# Patient Record
Sex: Male | Born: 1990 | Race: White | Hispanic: No | Marital: Married | Smoking: Current every day smoker
Health system: Southern US, Community
[De-identification: ages and names within clinical notes are randomized; demographics above are authoritative.]

## PROBLEM LIST (undated history)

## (undated) DIAGNOSIS — J329 Chronic sinusitis, unspecified: Secondary | ICD-10-CM

## (undated) DIAGNOSIS — L509 Urticaria, unspecified: Secondary | ICD-10-CM

## (undated) DIAGNOSIS — F419 Anxiety disorder, unspecified: Secondary | ICD-10-CM

## (undated) DIAGNOSIS — J45909 Unspecified asthma, uncomplicated: Secondary | ICD-10-CM

## (undated) HISTORY — PX: TONSILLECTOMY: SUR1361

## (undated) HISTORY — DX: Chronic sinusitis, unspecified: J32.9

## (undated) HISTORY — DX: Urticaria, unspecified: L50.9

## (undated) HISTORY — PX: CHOLECYSTECTOMY: SHX55

## (undated) HISTORY — DX: Anxiety disorder, unspecified: F41.9

## (undated) HISTORY — DX: Unspecified asthma, uncomplicated: J45.909

## (undated) HISTORY — PX: WISDOM TOOTH EXTRACTION: SHX21

---
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
## 2014-02-12 ENCOUNTER — Emergency Department (HOSPITAL_COMMUNITY)
Admission: EM | Admit: 2014-02-12 | Discharge: 2014-02-12 | Disposition: A | Discharge status: Home / Self Care | Payer: Self-pay | Attending: Emergency Medicine | Admitting: Emergency Medicine

## 2014-02-12 ENCOUNTER — Emergency Department (HOSPITAL_COMMUNITY): Payer: Self-pay

## 2014-02-12 DIAGNOSIS — T148XXA Other injury of unspecified body region, initial encounter: Secondary | ICD-10-CM

## 2014-02-12 DIAGNOSIS — S90222A Contusion of left lesser toe(s) with damage to nail, initial encounter: Secondary | ICD-10-CM

## 2014-02-12 DIAGNOSIS — X58XXXA Exposure to other specified factors, initial encounter: Secondary | ICD-10-CM | POA: Insufficient documentation

## 2014-02-12 DIAGNOSIS — Y998 Other external cause status: Secondary | ICD-10-CM | POA: Insufficient documentation

## 2014-02-12 DIAGNOSIS — Y9289 Other specified places as the place of occurrence of the external cause: Secondary | ICD-10-CM | POA: Insufficient documentation

## 2014-02-12 DIAGNOSIS — S90122A Contusion of left lesser toe(s) without damage to nail, initial encounter: Secondary | ICD-10-CM | POA: Insufficient documentation

## 2014-02-12 DIAGNOSIS — Y9389 Activity, other specified: Secondary | ICD-10-CM | POA: Insufficient documentation

## 2014-02-12 DIAGNOSIS — R52 Pain, unspecified: Secondary | ICD-10-CM

## 2014-02-12 DIAGNOSIS — S93602A Unspecified sprain of left foot, initial encounter: Secondary | ICD-10-CM

## 2014-02-12 MED ORDER — HYDROCODONE-ACETAMINOPHEN 5-325 MG PO TABS: 1.0000 | ORAL_TABLET | Freq: Four times a day (QID) | ORAL | Status: DC | PRN

## 2014-02-12 MED ORDER — HYDROMORPHONE HCL 1 MG/ML IJ SOLN
1.0000 mg | Freq: Once | INTRAMUSCULAR | Status: AC
  Administered 2014-02-12: 1 mg via INTRAVENOUS
  Filled 2014-02-12: qty 1
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
## 2014-02-12 NOTE — Discharge Instructions (Signed)
It was our pleasure to provide your ER care today - we hope that you feel better.  Elevate foot.  Use crutches and camwalker as need for comfort/support for the next few days.   Ice/coldpacks to sore area.  Take motrin or aleve as need for pain. You may also take hydrocodone as need for pain. No driving for the next 6 hours or when taking hydrocodone. Also, do not take tylenol or acetaminophen containing medication when taking hydrocodone.  Although no fracture is seen on today's xrays, there is the possibility of occult fracture, ligamentous and other soft tissue injury to the foot.   Follow up with orthopedist in 1 week if symptoms fail to improve/resolve - see referral - call to arrange appointment.    You were given pain medication in the ER - no driving for the next 4 hours.    Foot Contusion A foot contusion is a deep bruise to the foot. Contusions are the result of an injury that caused bleeding under the skin. The contusion may turn blue, purple, or yellow. Minor injuries will give you a painless contusion, but more severe contusions may stay painful and swollen for a few weeks. CAUSES  A foot contusion comes from a direct blow to that area, such as a heavy object falling on the foot. SYMPTOMS   Swelling of the foot.  Discoloration of the foot.  Tenderness or soreness of the foot. DIAGNOSIS  You will have a physical exam and will be asked about your history. You may need an X-ray of your foot to look for a broken bone (fracture).  TREATMENT  An elastic wrap may be recommended to support your foot. Resting, elevating, and applying cold compresses to your foot are often the best treatments for a foot contusion. Over-the-counter medicines may also be recommended for pain control. HOME CARE INSTRUCTIONS  1. Put ice on the injured area. 1. Put ice in a plastic bag. 2. Place a towel between your skin and the bag. 3. Leave the ice on for 15-20 minutes, 03-04 times a day. 2. Only  take over-the-counter or prescription medicines for pain, discomfort, or fever as directed by your caregiver. 3. If told, use an elastic wrap as directed. This can help reduce swelling. You may remove the wrap for sleeping, showering, and bathing. If your toes become numb, cold, or blue, take the wrap off and reapply it more loosely. 4. Elevate your foot with pillows to reduce swelling. 5. Try to avoid standing or walking while the foot is painful. Do not resume use until instructed by your caregiver. Then, begin use gradually. If pain develops, decrease use. Gradually increase activities that do not cause discomfort until you have normal use of your foot. 6. See your caregiver as directed. It is very important to keep all follow-up appointments in order to avoid any lasting problems with your foot, including long-term (chronic) pain. SEEK IMMEDIATE MEDICAL CARE IF:  1. You have increased redness, swelling, or pain in your foot. 2. Your swelling or pain is not relieved with medicines. 3. You have loss of feeling in your foot or are unable to move your toes. 4. Your foot turns cold or blue. 5. You have pain when you move your toes. 6. Your foot becomes warm to the touch. 7. Your contusion does not improve in 2 days. MAKE SURE YOU:  1. Understand these instructions. 2. Will watch your condition. 3. Will get help right away if you are not doing well or  get worse. Document Released: 11/24/2005 Document Revised: 08/04/2011 Document Reviewed: 01/06/2011 [facility] Patient Information 2015 [place], [place]. This information is not intended to replace advice given to you by your health care provider. Make sure you discuss any questions you have with your health care provider.    Foot Sprain The muscles and cord like structures which attach muscle to bone (tendons) that surround the feet are made up of units. A foot sprain can occur at the weakest spot in any of these units. This condition is most often  caused by injury to or overuse of the foot, as from playing contact sports, or aggravating a previous injury, or from poor conditioning, or obesity. SYMPTOMS  Pain with movement of the foot.  Tenderness and swelling at the injury site.  Loss of strength is present in moderate or severe sprains. THE THREE GRADES OR SEVERITY OF FOOT SPRAIN ARE: 7. Mild (Grade I): Slightly pulled muscle without tearing of muscle or tendon fibers or loss of strength. 8. Moderate (Grade II): Tearing of fibers in a muscle, tendon, or at the attachment to bone, with small decrease in strength. 9. Severe (Grade III): Rupture of the muscle-tendon-bone attachment, with separation of fibers. Severe sprain requires surgical repair. Often repeating (chronic) sprains are caused by overuse. Sudden (acute) sprains are caused by direct injury or over-use. DIAGNOSIS  Diagnosis of this condition is usually by your own observation. If problems continue, a caregiver may be required for further evaluation and treatment. X-rays may be required to make sure there are not breaks in the bones (fractures) present. Continued problems may require physical therapy for treatment. PREVENTION 8. Use strength and conditioning exercises appropriate for your sport. 9. Warm up properly prior to working out. 10. Use athletic shoes that are made for the sport you are participating in. 11. Allow adequate time for healing. Early return to activities makes repeat injury more likely, and can lead to an unstable arthritic foot that can result in prolonged disability. Mild sprains generally heal in 3 to 10 days, with moderate and severe sprains taking 2 to 10 weeks. Your caregiver can help you determine the proper time required for healing. HOME CARE INSTRUCTIONS  4. Apply ice to the injury for 15-20 minutes, 03-04 times per day. Put the ice in a plastic bag and place a towel between the bag of ice and your skin. 5. An elastic wrap (like an Ace bandage)  may be used to keep swelling down. 6. Keep foot above the level of the heart, or at least raised on a footstool, when swelling and pain are present. 7. Try to avoid use other than gentle range of motion while the foot is painful. Do not resume use until instructed by your caregiver. Then begin use gradually, not increasing use to the point of pain. If pain does develop, decrease use and continue the above measures, gradually increasing activities that do not cause discomfort, until you gradually achieve normal use. 8. Use crutches if and as instructed, and for the length of time instructed. 9. Keep injured foot and ankle wrapped between treatments. 10. Massage foot and ankle for comfort and to keep swelling down. Massage from the toes up towards the knee. 11. Only take over-the-counter or prescription medicines for pain, discomfort, or fever as directed by your caregiver. SEEK IMMEDIATE MEDICAL CARE IF:  1. Your pain and swelling increase, or pain is not controlled with medications. 2. You have loss of feeling in your foot or your foot turns cold  or blue. 3. You develop new, unexplained symptoms, or an increase of the symptoms that brought you to your caregiver. MAKE SURE YOU:  1. Understand these instructions. 2. Will watch your condition. 3. Will get help right away if you are not doing well or get worse. Document Released: 07/25/2001 Document Revised: 04/27/2011 Document Reviewed: 09/22/2007 [facility] Patient Information 2015 [place], [place]. This information is not intended to replace advice given to you by your health care provider. Make sure you discuss any questions you have with your health care provider.   Cryotherapy Cryotherapy means treatment with cold. Ice or gel packs can be used to reduce both pain and swelling. Ice is the most helpful within the first 24 to 48 hours after an injury or flare-up from overusing a muscle or joint. Sprains, strains, spasms, burning pain, shooting pain,  and aches can all be eased with ice. Ice can also be used when recovering from surgery. Ice is effective, has very few side effects, and is safe for most people to use. PRECAUTIONS  Ice is not a safe treatment option for people with:  Raynaud phenomenon. This is a condition affecting small blood vessels in the extremities. Exposure to cold may cause your problems to return.  Cold hypersensitivity. There are many forms of cold hypersensitivity, including:  Cold urticaria. Red, itchy hives appear on the skin when the tissues begin to warm after being iced.  Cold erythema. This is a red, itchy rash caused by exposure to cold.  Cold hemoglobinuria. Red blood cells break down when the tissues begin to warm after being iced. The hemoglobin that carry oxygen are passed into the urine because they cannot combine with blood proteins fast enough.  Numbness or altered sensitivity in the area being iced. If you have any of the following conditions, do not use ice until you have discussed cryotherapy with your caregiver: 10. Heart conditions, such as arrhythmia, angina, or chronic heart disease. 11. High blood pressure. 12. Healing wounds or open skin in the area being iced. 13. Current infections. 14. Rheumatoid arthritis. 15. Poor circulation. 16. Diabetes. Ice slows the blood flow in the region it is applied. This is beneficial when trying to stop inflamed tissues from spreading irritating chemicals to surrounding tissues. However, if you expose your skin to cold temperatures for too long or without the proper protection, you can damage your skin or nerves. Watch for signs of skin damage due to cold. HOME CARE INSTRUCTIONS Follow these tips to use ice and cold packs safely. 12. Place a dry or damp towel between the ice and skin. A damp towel will cool the skin more quickly, so you may need to shorten the time that the ice is used. 13. For a more rapid response, add gentle compression to the  ice. 14. Ice for no more than 10 to 20 minutes at a time. The bonier the area you are icing, the less time it will take to get the benefits of ice. 15. Check your skin after 5 minutes to make sure there are no signs of a poor response to cold or skin damage. 16. Rest 20 minutes or more between uses. 17. Once your skin is numb, you can end your treatment. You can test numbness by very lightly touching your skin. The touch should be so light that you do not see the skin dimple from the pressure of your fingertip. When using ice, most people will feel these normal sensations in this order: cold, burning, aching, and  numbness. 18. Do not use ice on someone who cannot communicate their responses to pain, such as small children or people with dementia. HOW TO MAKE AN ICE PACK Ice packs are the most common way to use ice therapy. Other methods include ice massage, ice baths, and cryosprays. Muscle creams that cause a cold, tingly feeling do not offer the same benefits that ice offers and should not be used as a substitute unless recommended by your caregiver. To make an ice pack, do one of the following: 12. Place crushed ice or a bag of frozen vegetables in a sealable plastic bag. Squeeze out the excess air. Place this bag inside another plastic bag. Slide the bag into a pillowcase or place a damp towel between your skin and the bag. 13. Mix 3 parts water with 1 part rubbing alcohol. Freeze the mixture in a sealable plastic bag. When you remove the mixture from the freezer, it will be slushy. Squeeze out the excess air. Place this bag inside another plastic bag. Slide the bag into a pillowcase or place a damp towel between your skin and the bag. SEEK MEDICAL CARE IF: 4. You develop white spots on your skin. This may give the skin a blotchy (mottled) appearance. 5. Your skin turns blue or pale. 6. Your skin becomes waxy or hard. 7. Your swelling gets worse. MAKE SURE YOU:  4. Understand these  instructions. 5. Will watch your condition. 6. Will get help right away if you are not doing well or get worse. Document Released: 09/29/2010 Document Revised: 06/19/2013 Document Reviewed: 09/29/2010 [facility] Patient Information 2015 [place], [place]. This information is not intended to replace advice given to you by your health care provider. Make sure you discuss any questions you have with your health care provider.   Crutch Use Crutches are used to take weight off one of your legs or feet when you stand or walk. It is important to use crutches that fit properly. When fitted properly:  Each crutch should be 2-3 finger widths below the armpit.  Your weight should be supported by your hand, and not by resting the armpit on the crutch.  RISKS AND COMPLICATIONS Damage to the nerves that extend from your armpit to your hand and arm. To prevent this from happening, make sure your crutches fit properly and do not put pressure on your armpit when using them. HOW TO USE YOUR CRUTCHES If you have been instructed to use partial weight bearing, apply (bear) the amount of weight as your health care provider suggests. Do not bear weight in an amount that causes pain to the area of injury. Walking 17. Step with the crutches. 18. Swing the healthy leg slightly ahead of the crutches. Going Up Steps If there is no handrail: 19. Step up with the healthy leg. 20. Step up with the crutches and injured leg. 21. Continue in this way. If there is a handrail: 14. Hold both crutches in one hand. 15. Place your free hand on the handrail. 16. While putting your weight on your arms, lift your healthy leg to the step. 17. Bring the crutches and the injured leg up to that step. 18. Continue in this way. Going Down Steps Be very careful, as going down stairs with crutches is very challenging. If there is no handrail: 8. Step down with the injured leg and crutches. 9. Step down with the healthy leg. If there  is a handrail: 7. Place your hand on the handrail. 8. Hold both crutches  with your free hand. 9. Lower your injured leg and crutch to the step below you. Make sure to keep the crutch tips in the center of the step, never on the edge. 10. Lower your healthy leg to that step. 11. Continue in this way. Standing Up 1. Hold the injured leg forward. 2. Grab the armrest with one hand and the top of the crutches with the other hand. 3. Using these supports, pull yourself up to a standing position. Sitting Down 1. Hold the injured leg forward. 2. Grab the armrest with one hand and the top of the crutches with the other hand. 3. Lower yourself to a sitting position. SEEK MEDICAL CARE IF:  You still feel unsteady on your feet.  You develop new pain, for example in your armpits, back, shoulder, wrist, or hip.  You develop any numbness or tingling. SEEK IMMEDIATE MEDICAL CARE IF: You fall. Document Released: 01/31/2000 Document Revised: 02/07/2013 Document Reviewed: 10/10/2012 [facility] Patient Information 2015 [place], [place]. This information is not intended to replace advice given to you by your health care provider. Make sure you discuss any questions you have with your health care provider.

## 2014-02-12 NOTE — ED Notes (Signed)
Notified ortho for need for assistive device

## 2014-02-12 NOTE — ED Notes (Signed)
Bed: [facility] Expected date:  Expected time:  Means of arrival:  Comments: ems 
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
## 2014-02-25 ENCOUNTER — Telehealth (HOSPITAL_BASED_OUTPATIENT_CLINIC_OR_DEPARTMENT_OTHER): Payer: Self-pay | Admitting: Emergency Medicine

## 2014-02-26 ENCOUNTER — Telehealth (HOSPITAL_COMMUNITY): Payer: Self-pay

## 2015-11-15 IMAGING — CR DG FOOT COMPLETE 3+V*L*
3 series · 3 of 3 positions shown · non-contrast
Comparison: None.

EXAM:
LEFT FOOT - COMPLETE 3+ VIEW

[x foot ap left]
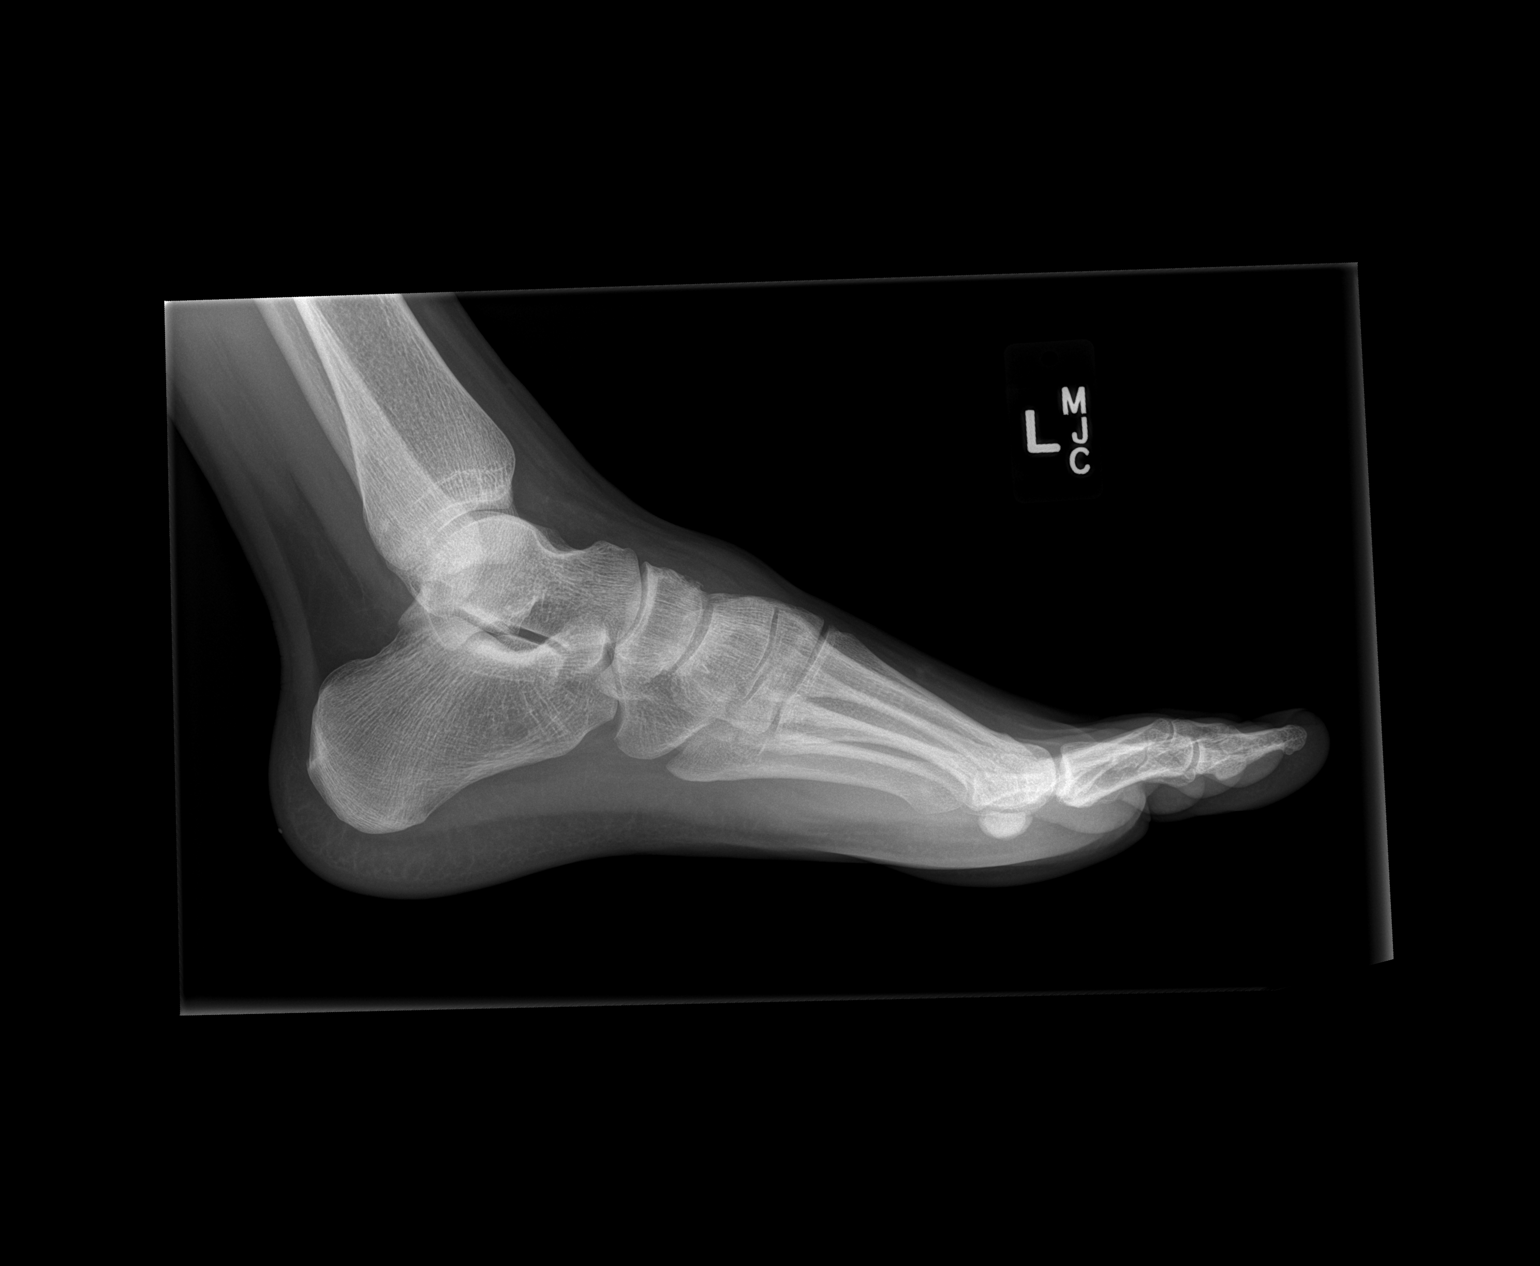

[x foot obl left]
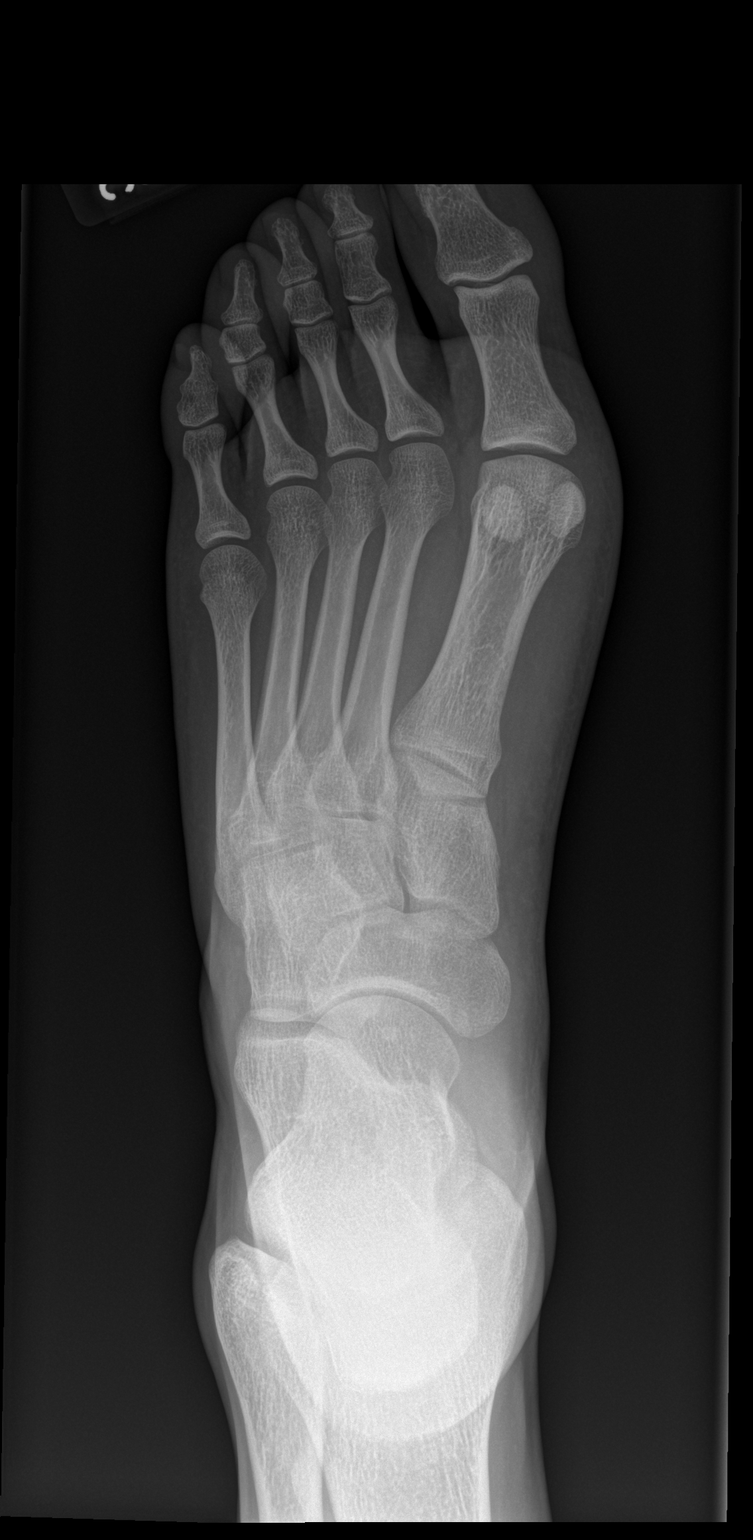

[x foot lat left]
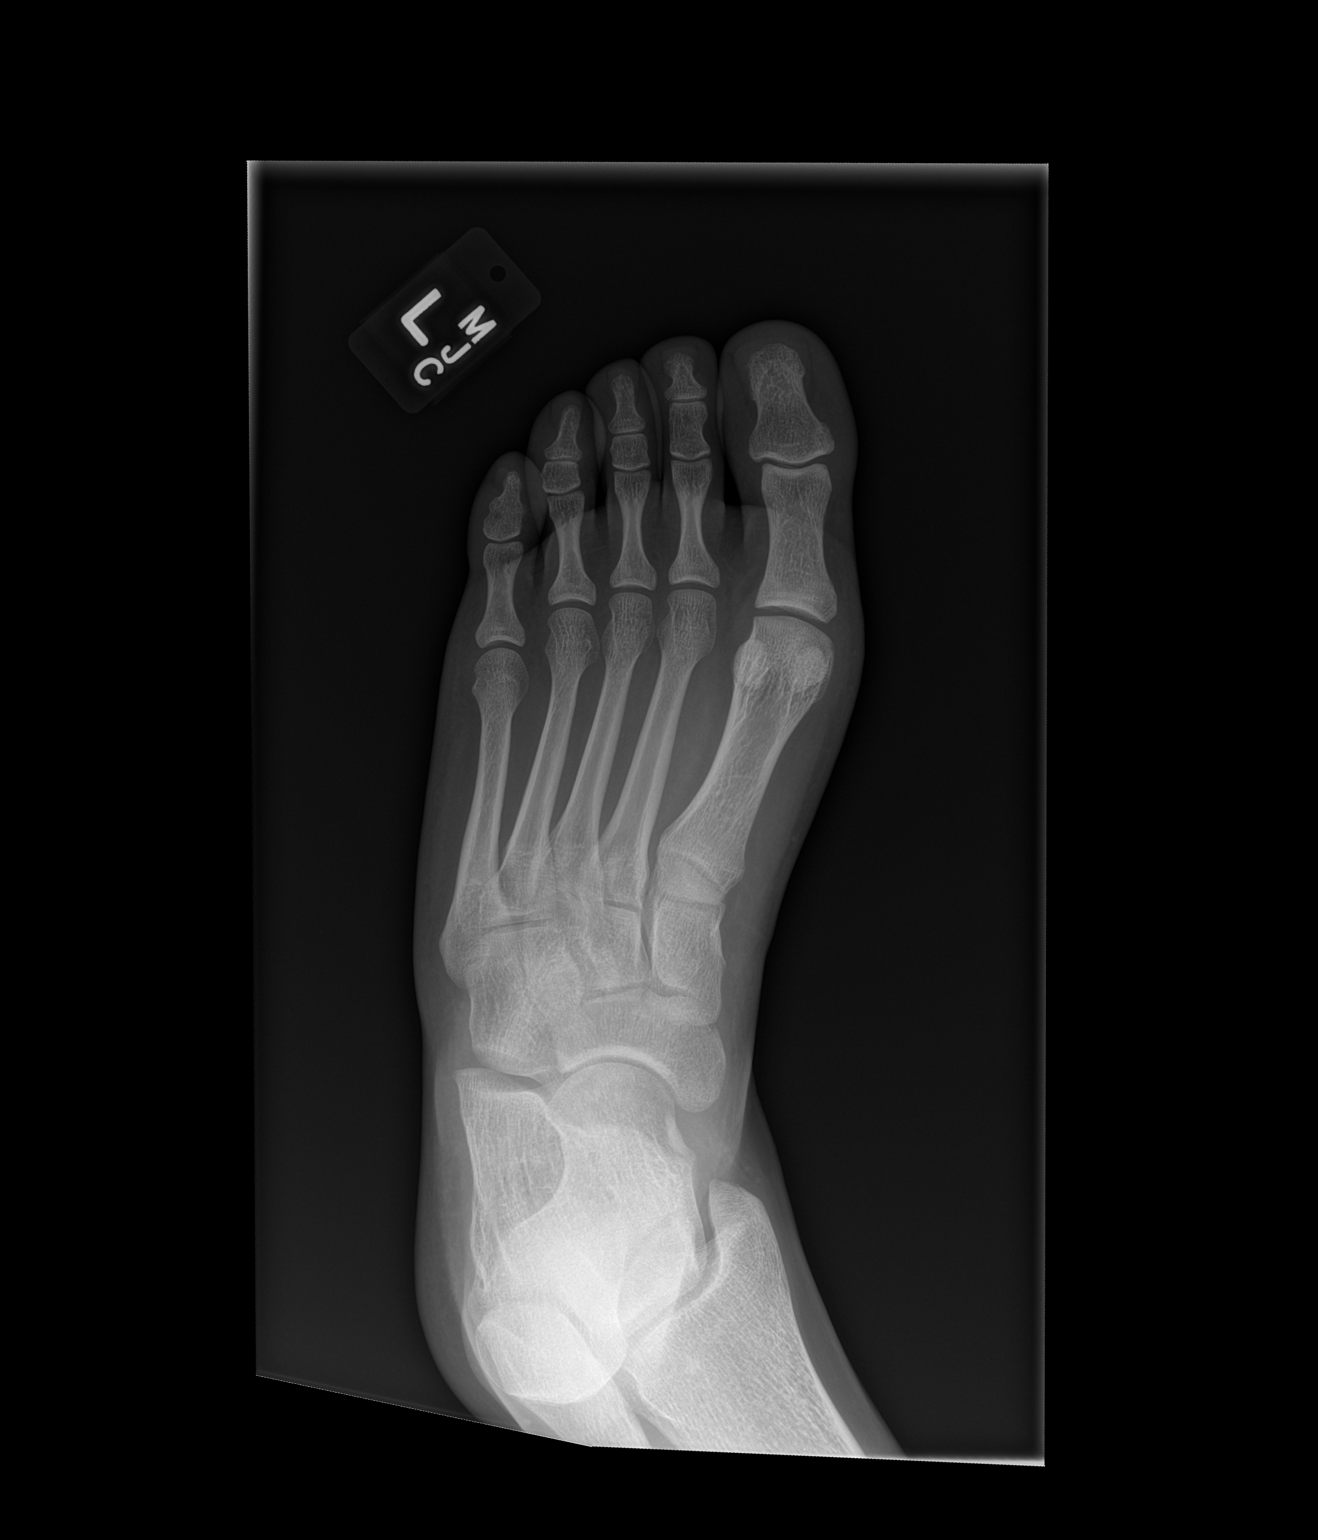

[3 of 3 positions shown; findings below may reference images not displayed]

FINDINGS: Mild hallux valgus is present. There is no fracture. Soft tissues
appear within normal limits.
IMPRESSION: Negative.

## 2017-03-23 ENCOUNTER — Encounter: Payer: Self-pay | Admitting: Allergy

## 2017-03-23 ENCOUNTER — Ambulatory Visit (INDEPENDENT_AMBULATORY_CARE_PROVIDER_SITE_OTHER): Payer: 59 | Admitting: Allergy

## 2017-03-23 VITALS — BP 136/92 | HR 76 | Temp 98.3°F | Resp 16 | Ht 71.0 in | Wt 218.7 lb

## 2017-03-23 DIAGNOSIS — R05 Cough: Secondary | ICD-10-CM | POA: Diagnosis not present

## 2017-03-23 DIAGNOSIS — H1010 Acute atopic conjunctivitis, unspecified eye: Secondary | ICD-10-CM

## 2017-03-23 DIAGNOSIS — J309 Allergic rhinitis, unspecified: Secondary | ICD-10-CM

## 2017-03-23 DIAGNOSIS — R058 Other specified cough: Secondary | ICD-10-CM

## 2017-03-23 DIAGNOSIS — R769 Abnormal immunological finding in serum, unspecified: Secondary | ICD-10-CM

## 2017-03-23 MED ORDER — FLUTICASONE FUROATE 100 MCG/ACT IN AEPB: 1.0000 | INHALATION_SPRAY | Freq: Every day | RESPIRATORY_TRACT | 5 refills | Status: DC

## 2017-03-23 MED ORDER — EPINEPHRINE 0.3 MG/0.3ML IJ SOAJ: INTRAMUSCULAR | 1 refills | Status: DC

## 2017-03-23 MED ORDER — AZELASTINE HCL 0.1 % NA SOLN: NASAL | 5 refills | Status: DC

## 2017-03-23 MED ORDER — LEVALBUTEROL TARTRATE 45 MCG/ACT IN AERO: 2.0000 | INHALATION_SPRAY | Freq: Four times a day (QID) | RESPIRATORY_TRACT | 1 refills | Status: DC | PRN

## 2017-03-23 NOTE — Progress Notes (Signed)
New Patient Note  RE: Gregory Lambert MRN: [id number] DOB: 03/10/1990 Date of Office Visit: 03/23/2017  Referring provider: No ref. provider found Primary care provider: Hal Morales, NP  Chief Complaint: allergy issue  History of present illness: Gregory Lambert is a 27 y.o. male presenting today for evaluation of allergy symptoms with abnormal labs.    He reports he has "spells" where he has difficulty breathing and feeling dizzy.  This has been going on for couple years.  He feels these episodes have gotten worse to the point where he is having trouble working as the spells have become more frequent. He works outside a lot and is not sure if he is reacting to allergens in the environment.  These spells occur about daily and last about a hour before resolving.  He also states he has been having a cough for the past 6 months.  Cough is usually dry and does feel like there is an itch in his throat that then leads to cough.  He does feel that he has some wheeze and chest tightness as well.  He reports the nighttime is better time of the day for him as he does not recall having any breathing difficulties at night.  He also states he has times where he lips and tongue tingle, throat feels tight and ears itch.  He denies any swelling.  The newest symptoms he has been having is hives that are itchy and usually on his back but have been wrapping around towards this abdomen.  Hives started about a month or so ago.  He does report generalized itch.  No joint aches/pains.  He reports he had episode of rash after wearing a "cheap sweater" to an [employer] event this Christmas; this was the first time with rash and then it return about a month ago without an identifiable trigger.  He states they did change dryer sheets about a month ago and his 80yr old son also has a similar type rash and his clothes are also dried with dryer sheets.  They have since stopped using the dryer sheets.      For treatment of  symptoms he had been taking Xyzal.  He states when he first started taking xyzal his ysmptoms improved but symptoms came back about about 2 weeks later.  He states the cough he had developed while on Xyzal and read that xyzal could develop a cough thus he stopped.  However he did try it again and did not feel it was helping so he stopped it again.  He has flonase but reports very infrequent use.  He does use Afrin for severe nasal obstruction and only uses for couple days at a time. He was recommend to take singulair and he took first dose and felt "not himself" and like "zombie" so he stopped use.  For the breathing issues he states he did use a family members albuterol inhaler and does not feel it helped.     He did see Dr. Fransico Setters with [facility] in October however pt states he was told he did not have any allergies from his testing and did not need any additional testing.  However upon reviewing record and previous testing it was noted he was sensitive to dust mites, cat, dog and cockroach.   The serum IgE testing was done by his PCP prior to seeing Dr. Fransico Setters.  Food allergy testing was done by the PCP as well that showed sensitivity to milk, shrimp and  scallop.  He reports eating/drinking dairy products without issue as well as shrimp.  He has never had scallop.  He also had gluten IgG done that was elevated and per pt he was told he was allergic to gluten and thus he took gluten out of the diet for about a month and he did not notice a difference in his symptoms but he put gluten back in the diet.  He denies having any cutaneous, respiratory, GI or CV related symptoms with any food ingestions.      He denies history of eczema or previous diagnosis of asthma and prior dagnosis of food allergy.    Review of systems: Review of Systems  Constitutional: Negative for chills, fever and malaise/fatigue.  HENT: Positive for congestion. Negative for ear discharge, ear pain, hearing loss, nosebleeds, sinus  pain, sore throat and tinnitus.   Eyes: Negative for pain, discharge and redness.  Respiratory: Positive for cough, shortness of breath and wheezing. Negative for sputum production.   Cardiovascular: Negative for chest pain.  Gastrointestinal: Negative for abdominal pain, constipation, diarrhea, heartburn, nausea and vomiting.  Musculoskeletal: Negative for joint pain.  Skin: Positive for itching and rash.  Neurological: Negative for headaches.    All other systems negative unless noted above in HPI  Past medical history: Past Medical History:  Diagnosis Date  . Anxiety   . Sinusitis   . Urticaria     Past surgical history: Past Surgical History:  Procedure Laterality Date  . CHOLECYSTECTOMY    . TONSILLECTOMY    . WISDOM TOOTH EXTRACTION      Family history:  Family History  Problem Relation Age of Onset  . Asthma Maternal Grandmother   . Allergic rhinitis Neg Hx   . Angioedema Neg Hx   . Eczema Neg Hx   . Immunodeficiency Neg Hx   . Urticaria Neg Hx     Social history: He lives in a home without carpeting with gas and electric heating and central cooling.  There are 2 dogs in the home.  There is no concern for water damage, mildew or roaches in the home.  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  Tobacco Use  . Smoking status: Current Some Day Smoker    Packs/day: 0.25    Years: 17.00    Pack years: 4.25    Types: Cigarettes  . Smokeless tobacco: Current User    Types: Chew    Medication List: Allergies as of 03/23/2017      Reactions   Montelukast    Gave a weird feeling      Medication List        Accurate as of 03/23/17 12:13 PM. Always use your most recent med list.          clonazePAM 0.5 MG tablet Commonly known as:  KLONOPIN   levocetirizine 5 MG tablet Commonly known as:  XYZAL       Known medication allergies: Allergies  Allergen Reactions  . Montelukast     Gave a weird feeling     Physical examination: Blood pressure (!) 136/92,  pulse 76, temperature 98.3 F (36.8 C), temperature source Oral, resp. rate 16, height 5\' 11"  (1.803 m), weight 218 lb 11.1 oz (99.2 kg).  General: Alert, interactive, in no acute distress. HEENT: PERRLA, TMs pearly gray, turbinates moderately edematous with clear discharge, post-pharynx non erythematous. Neck: Supple without lymphadenopathy. Lungs: Clear to auscultation without wheezing, rhonchi or rales. {no increased work of breathing. CV: Normal S1, S2 without  murmurs. Abdomen: Nondistended, nontender. Skin: Warm and dry, without lesions or rashes. Extremities:  No clubbing, cyanosis or edema. Neuro:   Grossly intact.  Diagnositics/Labs: Labs:  Serum IgE testing in kU/L from 09/29/2016:  Dermatophagoides Pteronyssinus IgE (d1) 2.70 (H)  Dermatophagoides Farinae IgE (d2) 3.09 (H)  Cat Dander  0.86 (H)  DOG DANDER 1.63 (H)  Cockroach,German IgE (i6) 0.49 (H)   Negative to Mouse Urine Proteins, [place] Southern Territ[place]MUDA GRASS, Timothy Grass, JOHNSON GRASS, Penicillium Notatum, CLADOSPORIUM HERBARUM, ASPERGILLUS, Alternaria Alternata, Box Elder IgE, Cottonwood Tree, Pecan/Hickory Tree IgE, EdgewaterBirch, Ryder SystemMountain Juniper IgE (t6), Live/[place] Gur[place]Oak, [address] Southeast[place] Tree, [place]m Tree, Common Ragweed, Rough Pigweed IgE, Sheep Sorrel IgE   Food IgE in kU/L:  Egg White <0.10  Milk, Cow's 2.95 (H)  Fish Cod <0.10  Wheat IgE 0.10 (H)  PEANUT <0.10  Soybean IgE <0.10  Hazelnut <0.10  Almonds <0.10  Shrimp IgE 0.45 (H)  Tuna IgE  <0.10   Salmon <0.10  Cashew <0.10  Scallop IgE 0.11 (H)  F010 Sesame Seed <0.10  WALNUT <0.10  Gluten  <0.10   Gluten IgG  <2.0 mcg/mL 17.3 (H)    IMMUNOGLOBULIN E  267.00 (H)   WBC 4.8 - 10.8 x 10*3/uL 8.3  Red Blood Count 4.70 - 6.10 x 10*6/uL 5.26  HEMOGLOBIN 14.0 - 18.0 G/DL 16.116.3  HEMATOCRIT 09.642.0 - 52.0 % 47.5  MCV 80.0 - 94.0 FL 90.4  MCH 27.0 - 31.0 PG 31.0  MCHC 33.0 - 37.0 G/DL 04.534.3  RDW 40.911.5 - 81.114.5 % 13.5  PLATELET COUNT 160 - 360 X 10*3/uL 256  MPV 6.8 - 10.2 FL 9.5    NEUTROPHILS RELATIVE PERCENT % 62  LYMPHOCYTES RELATIVE PERCENT % 27  MONOCYTES RELATIVE PERCENT 3 - 12 % 8  EOSINOPHILS RELATIVE PERCENT % 2  BASOPHILS RELATIVE PERCENT % 1  NEUTROPHILS ABSOLUTE COUNT 1.6 - 7.3 x 10*3/uL 5.2  LYMPHOCYTES ABSOLUTE COUNT 1.0 - 5.1 x 10*3/uL 2.2  MONOCYTES ABSOLUTE COUNT 0.2 - 0.9 x 10*3/uL 0.6  EOSINOPHILS ABSOLUTE COUNT 0.0 - 0.5 x 10*3/uL 0.2  BASOPHILS ABSOLUTE COUNT 0.0 - 0.2 x 10*3/uL 0.1  SODIUM 135 - 146 MMOL/L 135  POTASSIUM 3.5 - 5.3 MMOL/L 3.9  CHLORIDE 98 - 110 MMOL/L 100  CO2 23 - 30 MMOL/L 25  BUN 8 - 24 MG/DL 11  GLUCOSE 70 - 99 MG/DL 98  CREATININE 9.140.50 - 7.821.50 MG/DL 9.560.91  CALCIUM 8.5 - 21.310.5 MG/DL 08.610.1  TOTAL PROTEIN 6.0 - 8.3 G/DL 7.8  Albumin 3.5 - 5.0 G/DL 4.8  BILIRUBIN TOTAL 0.1 - 1.2 MG/DL 0.5  ALKALINE PHOSPHATASE 25 - 125 IU/L 80  AST 5 - 40 IU/L 71 (H)  ALT 5 - 50 IU/L 147 (H)  ANION GAP 4 - 14 MMOL/L 10  EST. GFR NON-BLACK >=60  EST. GFR BLACK >60   TSH  0.450 - 5.330 UIU/ML 1.920  T4 TOTAL  5.5 - 11.8 mcg/dL 6.5  T3 Uptake  57.832.0 - 45.0 % 36.0  FREE THYROXINE INDEX  4.8 - 11.3 6.0  CK  50 - 180 IU/L 155  TROPONIN I  0.000 - 0.040 NG/ML <0.006   Spirometry: FEV1: 4.18L  90%, FVC: 4.79L  84%, ratio consistent with nonobstructive pattern  Allergy testing: environmental allergy skin prick testing is positive to oak tree, dust mites, horse and cockroach.   Intradermal testing is positive to grass mix, mold mix 1,2,3,4, cat and dog Allergy testing results were read and interpreted by provider, documented by clinical staff.  Assessment and plan:   Allergic rhinoconjunctivitis    - environmental allergy testing is positive to tree pollen, grass pollen, dust mites, molds, cat, dog, horse and cockroach.  Allergen avoidance measures discussed and provided. - for antihistamine trial Allegra 180mg  daily  - ok to use Afrin as needed for severe nasal congestion no more than 3-5 days at a time.  Use 2 sprays each nostril then  wait 5-15 minutes until you are breathing more freely then follow with your medicated nose spray.  Provided with sample of Nasocort to use 2 sprays each nostril daily for nasal congestion.  Use for 1-2 weeks at a time before stopping once symptoms improve. - for post nasal drainage and throat itch recommend use of Astelin nasal antihistamine. Use 2 sprays each nostril up to twice a day.   - allergen immunotherapy (allergy injections) discussed today including protocol, benefits and risks.  Will prescribe Epipen if you decide to proceed to bring on days of your injections.  Please sign consent and can check with your insurance for coverage.  Call us when you are ready to start.   Cough - he may have a component of allergic asthma as respiratory symptoms appear to be tied with worsening allergy symptoms.  - will have him trial Arnuity 1 inhalation daily.   -  Xopenex for use 2 puffs every 4-6 hours as needed for cough/wheeze/shortness of breath/chest tightness.  May use 15-20 minutes prior to activity.   Monitor frequency of use.  Xopenex is being prescribed as he did not feel much improvement with use of albuterol.   - it is also likely that he may have a component of VCD leading to cough as he does endorse tickle in throat prior to onset of cough  Abnormal labs - testing to foods via serum IgE showed detectable levels to milk, shrimp and scallop.  He eats dairy products regularly without issue as well as eats shrimp on occasion without issue.  He has never had scallop.  Skin pricks to there foods are negative.  The detectable IgE levels represent false positive results as he tolerates the foods.  He does not have IgE mediated food allergy.  Also discussed that IgG is not useful as a diagnostic study in diagnosing IgE mediated food allergy.  It merely means he has had exposure to gluten.  He does not have a gluten allergy.    Follow-up 3-4 months or sooner if needed  I appreciate the opportunity  to take part in Hiram's care. Please do not hesitate to contact me with questions.  Sincerely,   Margo Aye, MD Allergy/Immunology [facility]

## 2017-03-23 NOTE — Patient Instructions (Addendum)
-   environmental allergy testing is positive to tree pollen, grass pollen, dust mites, molds, cat, dog, horse and cockroach Allergen avoidance measures discussed and provided.   - food allergy testing to milk, shrimp and scallop were negative thus your blood done was falsely positive. You do not have food allergy at this time  - allergen immunotherapy (allergy injections) discussed today including protocol, benefits and risks.  Will prescribe Epipen if you decide to proceed to bring on days of your injections.  Please sign consent and can check with your insurance for coverage.  Call us when you are ready to start.    - ok to use Afrin as needed for severe nasal congestion.  Use 2 sprays each nostril then wait 5-15 minutes until you are breathing more freely then follow with your medicated nose spray.  Provided with sample of Nasocort to use 2 sprays each nostril daily for nasal congestion.  Use for 1-2 weeks at a time before stopping once symptoms improve.    - for antihistamine trial Allegra 180mg  daily  - for post nasal drainage and throat itch recommend use of Astelin nasal antihistamine. Use 2 sprays each nostril up to twice a day.    - for cough will have you try Arnuity 100mcg 1 inhalation daily.  Take this until your follow-up visit.  This medication is an asthma control medication and should help decrease cough/respiratory symptoms you are having.    - will prescribe Xopenex for use 2 puffs every 4-6 hours as needed for cough/wheeze/shortness of breath/chest tightness.  May use 15-20 minutes prior to activity.   Monitor frequency of use.    Follow-up 3-4 months or sooner if needed

## 2017-04-01 DIAGNOSIS — J3089 Other allergic rhinitis: Secondary | ICD-10-CM | POA: Diagnosis not present

## 2017-04-01 NOTE — Progress Notes (Signed)
VIALS EXP 04-01-18 

## 2017-04-01 NOTE — Progress Notes (Addendum)
[facility]

## 2017-04-01 NOTE — Addendum Note (Signed)
Addended by: Lorrin MaisPADGETT, Colston Pyle P on: 04/01/2017 01:27 PM   Modules accepted: Orders

## 2017-04-02 DIAGNOSIS — J301 Allergic rhinitis due to pollen: Secondary | ICD-10-CM | POA: Diagnosis not present

## 2017-04-08 ENCOUNTER — Other Ambulatory Visit: Payer: Self-pay | Admitting: *Deleted

## 2017-04-08 ENCOUNTER — Ambulatory Visit (INDEPENDENT_AMBULATORY_CARE_PROVIDER_SITE_OTHER): Payer: 59 | Admitting: *Deleted

## 2017-04-08 ENCOUNTER — Ambulatory Visit: Payer: Self-pay | Admitting: Allergy and Immunology

## 2017-04-08 DIAGNOSIS — J309 Allergic rhinitis, unspecified: Secondary | ICD-10-CM

## 2017-04-08 MED ORDER — LEVOCETIRIZINE DIHYDROCHLORIDE 5 MG PO TABS: ORAL_TABLET | ORAL | 5 refills | Status: DC

## 2017-04-08 NOTE — Progress Notes (Signed)
Patient started injs Blue 100,000 ato 0.05 dosage each 1-mold-cr-mite, 1- pollen-pet Reviewed side effects, injection sch B 1-2 time weekly. Reviewed how/when to use epipen

## 2017-04-09 ENCOUNTER — Other Ambulatory Visit: Payer: Self-pay | Admitting: Allergy

## 2017-04-09 NOTE — Progress Notes (Signed)
Called by pt his regular pharmacy ([facility]) does not have arnuity in stock and he states he ran out of the sample provided in office 2 days ago and he has noted improvement in cough symptoms and would like to locate a device today to get back on it.  Located another pharmacy ([facility] [address]) with it in stock.

## 2017-04-12 ENCOUNTER — Ambulatory Visit (INDEPENDENT_AMBULATORY_CARE_PROVIDER_SITE_OTHER): Payer: 59 | Admitting: *Deleted

## 2017-04-12 DIAGNOSIS — J309 Allergic rhinitis, unspecified: Secondary | ICD-10-CM

## 2017-04-15 ENCOUNTER — Ambulatory Visit (INDEPENDENT_AMBULATORY_CARE_PROVIDER_SITE_OTHER): Payer: 59 | Admitting: *Deleted

## 2017-04-15 DIAGNOSIS — J309 Allergic rhinitis, unspecified: Secondary | ICD-10-CM

## 2017-04-19 ENCOUNTER — Ambulatory Visit (INDEPENDENT_AMBULATORY_CARE_PROVIDER_SITE_OTHER): Payer: 59 | Admitting: *Deleted

## 2017-04-19 DIAGNOSIS — J309 Allergic rhinitis, unspecified: Secondary | ICD-10-CM

## 2017-04-22 ENCOUNTER — Ambulatory Visit (INDEPENDENT_AMBULATORY_CARE_PROVIDER_SITE_OTHER): Payer: 59 | Admitting: *Deleted

## 2017-04-22 DIAGNOSIS — J309 Allergic rhinitis, unspecified: Secondary | ICD-10-CM

## 2017-04-26 ENCOUNTER — Ambulatory Visit: Payer: Self-pay | Admitting: *Deleted

## 2017-04-26 ENCOUNTER — Ambulatory Visit (INDEPENDENT_AMBULATORY_CARE_PROVIDER_SITE_OTHER): Payer: 59 | Admitting: Allergy and Immunology

## 2017-04-26 VITALS — BP 132/74 | HR 80 | Resp 20

## 2017-04-26 DIAGNOSIS — J309 Allergic rhinitis, unspecified: Secondary | ICD-10-CM | POA: Diagnosis not present

## 2017-04-26 DIAGNOSIS — K219 Gastro-esophageal reflux disease without esophagitis: Secondary | ICD-10-CM | POA: Diagnosis not present

## 2017-04-26 DIAGNOSIS — J4540 Moderate persistent asthma, uncomplicated: Secondary | ICD-10-CM

## 2017-04-26 DIAGNOSIS — R945 Abnormal results of liver function studies: Secondary | ICD-10-CM

## 2017-04-26 DIAGNOSIS — L299 Pruritus, unspecified: Secondary | ICD-10-CM

## 2017-04-26 DIAGNOSIS — R7989 Other specified abnormal findings of blood chemistry: Secondary | ICD-10-CM

## 2017-04-26 DIAGNOSIS — H1010 Acute atopic conjunctivitis, unspecified eye: Secondary | ICD-10-CM | POA: Diagnosis not present

## 2017-04-26 MED ORDER — METHYLPREDNISOLONE ACETATE 80 MG/ML IJ SUSP
80.0000 mg | Freq: Once | INTRAMUSCULAR | Status: AC
  Administered 2017-04-26: 80 mg via INTRAMUSCULAR

## 2017-04-26 MED ORDER — BUDESONIDE-FORMOTEROL FUMARATE 160-4.5 MCG/ACT IN AERO: 2.0000 | INHALATION_SPRAY | Freq: Two times a day (BID) | RESPIRATORY_TRACT | 2 refills | Status: DC

## 2017-04-26 NOTE — Progress Notes (Signed)
Follow-up Note  Referring Provider: Hal MoralesGunter, Tara G, NP Primary Provider: Hal MoralesGunter, Tara G, NP Date of Office Visit: 04/26/2017  Subjective:   Gregory Lambert (DOB: 31-May-1990) is a 27 y.o. male who [facility] in re-evaluation of the following:  HPI: Gregory Lambert returns to this clinic in evaluation of multiple issues addressed by Dr. Delorse LekPadgett on 23 March 2017.  During his last visit he was diagnosed with allergic rhinitis and possible component of asthma and he also had a pruritic disorder.  All the therapy prescribed during that visit has not helped him in regard to his respiratory tract or his pruritus.  He complains of cough which has been a nagging issue for at least 4 months or so ever since he moved into his 27 year old house approximally 4 months ago.  He has attempted to perform allergen avoidance measures against various aeroallergens including his animals who still remain inside the household.  He has throat clearing and drainage in his throat and apparently his throat clearing is driving everyone crazy in the family.  He does not really have a tremendous amount of chest tightness but does have shortness of breath on occasion.  He has tried a short acting bronchodilator which has not helped him and he was given Arnuity during his last visit which has not helped him.  He apparently has had a chest x-ray in the past which has been normal.  He does not have any significant symptoms to suggest classic reflux.  He does drink 2 coca colas per day and has 24 cans of beer per week.  He does have some issues with sniffing and some occasional congestion in his nose but he can smell without any difficulty and does not have a history of ugly nasal discharge.  He has been itchy for at least 4 months involving predominantly his torso.  Sometimes his neck is involved as well.  He has tried Xyzal which does help this issue.  With blood test obtained August  2018 he had a ALT 147 IU/L and AST 71 IU/L.  He has started immunotherapy directed against various aeroallergens for the past 3 weeks and this form of therapy has been going well without any adverse effect.  Allergies as of 04/26/2017      Reactions   Montelukast    Gave a weird feeling   Prednisone Palpitations      Medication List      azelastine 0.1 % nasal spray Commonly known as:  ASTELIN 2 sprays per nostril up to twice a day.   clonazePAM 0.5 MG tablet Commonly known as:  KLONOPIN 0.5 mg at bedtime as needed.   EPINEPHrine 0.3 mg/0.3 mL Soaj injection Commonly known as:  EPIPEN 2-PAK Use as directed for severe allergy.   Fluticasone Furoate 100 MCG/ACT Aepb Commonly known as:  ARNUITY ELLIPTA Inhale 1 puff into the lungs daily.   levalbuterol 45 MCG/ACT inhaler Commonly known as:  XOPENEX HFA Inhale 2 puffs into the lungs every 6 (six) hours as needed for wheezing.   levocetirizine 5 MG tablet Commonly known as:  XYZAL Take one tablet once daily as directed       Past Medical History:  Diagnosis Date  . Anxiety   . Sinusitis   . Urticaria     Past Surgical History:  Procedure Laterality Date  . CHOLECYSTECTOMY    . TONSILLECTOMY    . WISDOM TOOTH EXTRACTION  Review of systems negative except as noted in HPI / PMHx or noted below:  Review of Systems  Constitutional: Negative.   HENT: Negative.   Eyes: Negative.   Respiratory: Negative.   Cardiovascular: Negative.   Gastrointestinal: Negative.   Genitourinary: Negative.   Musculoskeletal: Negative.   Skin: Negative.   Neurological: Negative.   Endo/Heme/Allergies: Negative.   Psychiatric/Behavioral: Negative.      Objective:   Vitals:   04/26/17 0946  BP: 132/74  Pulse: 80  Resp: 20          Physical Exam  Constitutional: He is well-developed, well-nourished, and in no distress.  Nothing, throat clearing, slight cough  HENT:  Head: Normocephalic.  Right Ear: Tympanic  membrane, external ear and ear canal normal.  Left Ear: Tympanic membrane, external ear and ear canal normal.  Nose: Nose normal. No mucosal edema or rhinorrhea.  Mouth/Throat: Uvula is midline, oropharynx is clear and moist and mucous membranes are normal. No oropharyngeal exudate.  Eyes: Conjunctivae are normal.  Neck: Trachea normal. No tracheal tenderness present. No tracheal deviation present. No thyromegaly present.  Cardiovascular: Normal rate, regular rhythm, S1 normal, S2 normal and normal heart sounds.  No murmur heard. Pulmonary/Chest: Breath sounds normal. No stridor. No respiratory distress. He has no wheezes (end expiratory wheezing heard on forced expiration posterior lung field). He has no rales.  Musculoskeletal: He exhibits no edema.  Lymphadenopathy:       Head (right side): No tonsillar adenopathy present.       Head (left side): No tonsillar adenopathy present.    He has no cervical adenopathy.  Neurological: He is alert. Gait normal.  Skin: No rash noted. He is not diaphoretic. No erythema. Nails show no clubbing.  Psychiatric: Mood and affect normal.    Diagnostics:    Spirometry was performed and demonstrated an FEV1 of 3.93 at 84 % of predicted.  Following the administration of nebulized albuterol his FEV1 did not change significantly.  The patient had an Asthma Control Test with the following results: ACT Total Score: 20.    Assessment and Plan:   1. Not well controlled moderate persistent asthma   2. Allergic rhinoconjunctivitis   3. LPRD (laryngopharyngeal reflux disease)   4. Pruritic disorder   5. Elevated liver function tests     1.  Perform allergen avoidance measures directed against house dust mite and animals as best as possible  2.  Treat and prevent inflammation:   A.  Symbicort 160 - 2 inhalations twice a day with spacer  B.  OTC Nasacort 1 spray each nostril 1 time per day  C.  Depo-Medrol 80 IM delivered in clinic today  3.  Treat and  prevent reflux:   A.  Consolidate caffeine and alcohol consumption  B.  OTC Ranitidine 150 1 tablet twice a day  4.  Treat and prevent itchiness:   A.  Xyzal 5 mg twice a day  5. If needed:   A. Proair HFA or similar - 2 inhalations every 4-6 hours  B. Nasal saline  5.  Investigate itchiness and elevated liver function tests with the following blood tests: CMP, CBC w/diff, TSH, T4, TP  6.  Further evaluation for elevated liver function test?  Yes if still elevated  7.  Continue immunotherapy and EpiPen / Auvi-Q  8.  Return to clinic 3 weeks or earlier if problem  Yafet has several issues that need to be addressed including his cough, what appears to be partially on  the basis of LPR, pruritic disorder, and elevated liver function tests.  I have assigned a plan noted above to address each 1 of these issues.  If he still has elevated liver function tests I think we are obligated to further investigate the cause of this abnormality as this may also be tied up with his pruritic disorder.  Hopefully his elevated liver function tests are just a manifestation of his relatively high alcohol intake.  If he still continues to have cough in the face of this therapy I think it would be worthwhile to obtain a CT scan of the sinuses to look for a persistent infection which may also be contributing to some of his pruritic disorder and also have him evaluated by ENT to take a look at his throat.  I will regroup with him in 3 weeks to assess his response to treatment.  Laurette Schimke, MD Allergy / Immunology [facility]

## 2017-04-26 NOTE — Patient Instructions (Addendum)
  1.  Perform allergen avoidance measures directed against house dust mite and animals as best as possible  2.  Treat and prevent inflammation:   A.  Symbicort 160 - 2 inhalations twice a day with spacer  B.  OTC Nasacort 1 spray each nostril 1 time per day  C.  Depo-Medrol 80 IM delivered in clinic today  3.  Treat and prevent reflux:   A.  Consolidate caffeine and alcohol consumption  B.  OTC Ranitidine 150 1 tablet twice a day  4.  Treat and prevent itchiness:   A.  Xyzal 5 mg twice a day  5. If needed:   A. Proair HFA or similar - 2 inhalations every 4-6 hours  B. Nasal saline  5.  Investigate itchiness and elevated liver function tests with the following blood tests: CMP, CBC w/diff, TSH, T4, TP  6.  Further evaluation for elevated liver function test?  Yes if still elevated  7.  Continue immunotherapy and EpiPen / Auvi-Q  8.  Return to clinic 3 weeks or earlier if problem

## 2017-04-27 ENCOUNTER — Encounter: Payer: Self-pay | Admitting: Allergy and Immunology

## 2017-04-28 LAB — COMPREHENSIVE METABOLIC PANEL
A/G RATIO: 2.2 (ref 1.2–2.2)
ALT: 143 IU/L — AB (ref 0–44)
AST: 62 IU/L — AB (ref 0–40)
Albumin: 5.3 g/dL (ref 3.5–5.5)
Alkaline Phosphatase: 89 IU/L (ref 39–117)
BUN/Creatinine Ratio: 12 (ref 9–20)
BUN: 12 mg/dL (ref 6–20)
Bilirubin Total: 0.4 mg/dL (ref 0.0–1.2)
CALCIUM: 9.8 mg/dL (ref 8.7–10.2)
CO2: 24 mmol/L (ref 20–29)
Chloride: 97 mmol/L (ref 96–106)
Creatinine, Ser: 0.97 mg/dL (ref 0.76–1.27)
GFR calc Af Amer: 124 mL/min/{1.73_m2} (ref >59)
GFR calc non Af Amer: 107 mL/min/{1.73_m2} (ref >59)
Globulin, Total: 2.4 g/dL (ref 1.5–4.5)
Glucose: 92 mg/dL (ref 65–99)
Potassium: 4.7 mmol/L (ref 3.5–5.2)
Sodium: 139 mmol/L (ref 134–144)
Total Protein: 7.7 g/dL (ref 6.0–8.5)

## 2017-04-28 LAB — CBC WITH DIFFERENTIAL/PLATELET
Basophils Absolute: 0 10*3/uL (ref 0.0–0.2)
Basos: 0 %
EOS (ABSOLUTE): 0.2 10*3/uL (ref 0.0–0.4)
Eos: 2 %
Hematocrit: 46.3 % (ref 37.5–51.0)
Hemoglobin: 15.6 g/dL (ref 13.0–17.7)
IMMATURE GRANULOCYTES: 0 %
Immature Grans (Abs): 0 10*3/uL (ref 0.0–0.1)
Lymphocytes Absolute: 2.4 10*3/uL (ref 0.7–3.1)
Lymphs: 23 %
MCH: 30.7 pg (ref 26.6–33.0)
MCHC: 33.7 g/dL (ref 31.5–35.7)
MCV: 91 fL (ref 79–97)
Monocytes Absolute: 0.5 10*3/uL (ref 0.1–0.9)
Monocytes: 5 %
NEUTROS PCT: 70 %
Neutrophils Absolute: 7.4 10*3/uL — ABNORMAL HIGH (ref 1.4–7.0)
PLATELETS: 310 10*3/uL (ref 150–379)
RBC: 5.08 x10E6/uL (ref 4.14–5.80)
RDW: 13.4 % (ref 12.3–15.4)
WBC: 10.6 10*3/uL (ref 3.4–10.8)

## 2017-04-28 LAB — TSH: TSH: 2.63 u[IU]/mL (ref 0.450–4.500)

## 2017-04-28 LAB — THYROID PEROXIDASE ANTIBODY: THYROID PEROXIDASE ANTIBODY: 12 [IU]/mL (ref 0–34)

## 2017-04-28 LAB — T4: T4 TOTAL: 6.4 ug/dL (ref 4.5–12.0)

## 2017-04-29 ENCOUNTER — Ambulatory Visit (INDEPENDENT_AMBULATORY_CARE_PROVIDER_SITE_OTHER): Payer: 59 | Admitting: *Deleted

## 2017-04-29 DIAGNOSIS — J309 Allergic rhinitis, unspecified: Secondary | ICD-10-CM

## 2017-05-03 ENCOUNTER — Telehealth: Payer: Self-pay | Admitting: Allergy and Immunology

## 2017-05-03 ENCOUNTER — Ambulatory Visit (INDEPENDENT_AMBULATORY_CARE_PROVIDER_SITE_OTHER): Payer: 59 | Admitting: *Deleted

## 2017-05-03 ENCOUNTER — Other Ambulatory Visit: Payer: Self-pay | Admitting: *Deleted

## 2017-05-03 DIAGNOSIS — J309 Allergic rhinitis, unspecified: Secondary | ICD-10-CM | POA: Diagnosis not present

## 2017-05-03 DIAGNOSIS — R945 Abnormal results of liver function studies: Principal | ICD-10-CM

## 2017-05-03 DIAGNOSIS — R7989 Other specified abnormal findings of blood chemistry: Secondary | ICD-10-CM

## 2017-05-03 NOTE — Telephone Encounter (Signed)
Patient came today for allergy injections Patient left and went to have blood work done Was told there that they were drawing for allergies and his results would be back in about week Patient wanted to know what else he was being testing for since he had his injections today Please call

## 2017-05-03 NOTE — Telephone Encounter (Signed)
Called patient and informed him that we are doing blood tests to try to see why he has elevated liver function tests. We did not order any allergy blood tests.  Patient did ask if he needed to have milk blood test redone since he had it done with another doctor last year and it was positive.  I told him that he had a negative skin test for milk when he was tested here and patient says that he can drink milk without a problem.  I told him that sometimes blood tests can have false positives and that if he can consume milk without a problem then that might be true in his case.

## 2017-05-05 LAB — HBV/HCV (PROFILE VIII)
HCV Ab: <0.1 s/co ratio (ref 0.0–0.9)
HEP B C TOTAL AB: NEGATIVE
HEP B E AB: NEGATIVE
HEP B S AG: NEGATIVE
Hep B C IgM: NEGATIVE
Hep B E Ag: NEGATIVE
Hep B Surface Ab, Qual: REACTIVE

## 2017-05-05 LAB — ANTI-SMOOTH MUSCLE ANTIBODY, IGG: SMOOTH MUSCLE AB: 9 U (ref 0–19)

## 2017-05-05 LAB — ANTI-MICROSOMAL ANTIBODY LIVER / KIDNEY: LKM1 Ab: 3.8 Units (ref 0.0–20.0)

## 2017-05-05 LAB — MITOCHONDRIAL ANTIBODIES: Mitochondrial Ab: <20 Units (ref 0.0–20.0)

## 2017-05-05 LAB — HCV COMMENT:

## 2017-05-05 LAB — ANA W/REFLEX IF POSITIVE: Anti Nuclear Antibody(ANA): NEGATIVE

## 2017-05-05 LAB — HIV ANTIBODY (ROUTINE TESTING W REFLEX): HIV Screen 4th Generation wRfx: NONREACTIVE

## 2017-05-06 ENCOUNTER — Ambulatory Visit (INDEPENDENT_AMBULATORY_CARE_PROVIDER_SITE_OTHER): Payer: 59 | Admitting: *Deleted

## 2017-05-06 ENCOUNTER — Telehealth: Payer: Self-pay | Admitting: Allergy and Immunology

## 2017-05-06 DIAGNOSIS — J309 Allergic rhinitis, unspecified: Secondary | ICD-10-CM | POA: Diagnosis not present

## 2017-05-06 NOTE — Telephone Encounter (Signed)
Explained difference in balances and that he received his vials in February - kt

## 2017-05-06 NOTE — Telephone Encounter (Signed)
Can came in and asked for his balance.  When I told him how much it is he questioned the amount.  He states he received a bill that had a different amount owed.  Then asked about the charges on 2-14 and on 2-15.  He would like a call back please.

## 2017-05-10 ENCOUNTER — Ambulatory Visit (INDEPENDENT_AMBULATORY_CARE_PROVIDER_SITE_OTHER): Payer: 59 | Admitting: *Deleted

## 2017-05-10 DIAGNOSIS — J309 Allergic rhinitis, unspecified: Secondary | ICD-10-CM | POA: Diagnosis not present

## 2017-05-13 ENCOUNTER — Ambulatory Visit (INDEPENDENT_AMBULATORY_CARE_PROVIDER_SITE_OTHER): Payer: 59 | Admitting: *Deleted

## 2017-05-13 DIAGNOSIS — J309 Allergic rhinitis, unspecified: Secondary | ICD-10-CM | POA: Diagnosis not present

## 2017-05-19 ENCOUNTER — Ambulatory Visit: Payer: 59 | Admitting: Allergy and Immunology

## 2017-05-20 ENCOUNTER — Ambulatory Visit (INDEPENDENT_AMBULATORY_CARE_PROVIDER_SITE_OTHER): Payer: 59 | Admitting: *Deleted

## 2017-05-20 DIAGNOSIS — J309 Allergic rhinitis, unspecified: Secondary | ICD-10-CM

## 2017-05-24 ENCOUNTER — Ambulatory Visit (INDEPENDENT_AMBULATORY_CARE_PROVIDER_SITE_OTHER): Payer: 59 | Admitting: *Deleted

## 2017-05-24 DIAGNOSIS — J309 Allergic rhinitis, unspecified: Secondary | ICD-10-CM | POA: Diagnosis not present

## 2017-05-26 ENCOUNTER — Ambulatory Visit (INDEPENDENT_AMBULATORY_CARE_PROVIDER_SITE_OTHER): Payer: 59 | Admitting: Allergy and Immunology

## 2017-05-26 ENCOUNTER — Encounter: Payer: Self-pay | Admitting: Allergy and Immunology

## 2017-05-26 VITALS — BP 120/96 | HR 80 | Resp 20

## 2017-05-26 DIAGNOSIS — H1010 Acute atopic conjunctivitis, unspecified eye: Secondary | ICD-10-CM

## 2017-05-26 DIAGNOSIS — B370 Candidal stomatitis: Secondary | ICD-10-CM

## 2017-05-26 DIAGNOSIS — F17210 Nicotine dependence, cigarettes, uncomplicated: Secondary | ICD-10-CM

## 2017-05-26 DIAGNOSIS — L299 Pruritus, unspecified: Secondary | ICD-10-CM

## 2017-05-26 DIAGNOSIS — K219 Gastro-esophageal reflux disease without esophagitis: Secondary | ICD-10-CM | POA: Diagnosis not present

## 2017-05-26 DIAGNOSIS — R7989 Other specified abnormal findings of blood chemistry: Secondary | ICD-10-CM

## 2017-05-26 DIAGNOSIS — J4540 Moderate persistent asthma, uncomplicated: Secondary | ICD-10-CM

## 2017-05-26 DIAGNOSIS — J309 Allergic rhinitis, unspecified: Secondary | ICD-10-CM | POA: Diagnosis not present

## 2017-05-26 DIAGNOSIS — R945 Abnormal results of liver function studies: Secondary | ICD-10-CM

## 2017-05-26 MED ORDER — AUVI-Q 0.3 MG/0.3ML IJ SOAJ: INTRAMUSCULAR | 3 refills | Status: DC

## 2017-05-26 MED ORDER — FLUCONAZOLE 150 MG PO TABS: ORAL_TABLET | ORAL | 0 refills | Status: DC

## 2017-05-26 NOTE — Progress Notes (Signed)
Follow-up Note  Referring Provider: Hal Morales, NP Primary Provider: Hal Morales, NP Date of Office Visit: 05/26/2017  Subjective:   Gregory Lambert (DOB: 21-Apr-1990) is a 27 y.o. male [facility] in re-evaluation of the following:  HPI: Gregory Lambert returns to this clinic in reevaluation of his asthma and allergic rhinitis and LPR and pruritic disorder and elevated liver function tests.  His last visit to this clinic was 26 April 2017.  All of his upper airway symptoms have resolved.  He does not have any issues with reflux at this point in time and has very little issues with his throat.  However, he still does cough.  After 2 weeks of using Symbicort he developed a very irritated mouth with redness of his lips and redness of his tongue.  He has discontinued this agent over the course of the past 2 weeks.  Apparently we went to see his primary care doctor who gave him a steroid shot yesterday.  He still continues to smoke about 1 pack of cigarettes per week.  He still continues to drink at least 24 cans of beer per week.  He still continues to drink 2 coca colas per day.  His pruritic disorder has resolved.  He has started a course of immunotherapy which is going well without any incident.  Allergies as of 05/26/2017      Reactions   Montelukast    Gave a weird feeling   Prednisone Palpitations      Medication List      AUVI-Q 0.3 mg/0.3 mL Soaj injection Generic drug:  EPINEPHrine Use as directed for life-threatening allergic reaction.   levocetirizine 5 MG tablet Commonly known as:  XYZAL Take one tablet once daily as directed   NASACORT ALLERGY 24HR 55 MCG/ACT Aero nasal inhaler Generic drug:  triamcinolone Place 1 spray into the nose daily.       Past Medical History:  Diagnosis Date  . Anxiety   . Asthma   . Sinusitis   . Urticaria     Past Surgical History:  Procedure Laterality Date  .  CHOLECYSTECTOMY    . TONSILLECTOMY    . WISDOM TOOTH EXTRACTION      Review of systems negative except as noted in HPI / PMHx or noted below:  Review of Systems  Constitutional: Negative.   HENT: Negative.   Eyes: Negative.   Respiratory: Negative.   Cardiovascular: Negative.   Gastrointestinal: Negative.   Genitourinary: Negative.   Musculoskeletal: Negative.   Skin: Negative.   Neurological: Negative.   Endo/Heme/Allergies: Negative.   Psychiatric/Behavioral: Negative.      Objective:   Vitals:   05/26/17 1523  BP: (!) 120/96  Pulse: 80  Resp: 20          Physical Exam  HENT:  Head: Normocephalic.  Right Ear: Tympanic membrane, external ear and ear canal normal.  Left Ear: Tympanic membrane, external ear and ear canal normal.  Nose: Nose normal. No mucosal edema or rhinorrhea.  Mouth/Throat: Uvula is midline, oropharynx is clear and moist and mucous membranes are normal. No oropharyngeal exudate (Thrush).  Eyes: Conjunctivae are normal.  Neck: Trachea normal. No tracheal tenderness present. No tracheal deviation present. No thyromegaly present.  Cardiovascular: Normal rate, regular rhythm, S1 normal, S2 normal and normal heart sounds.  No murmur heard. Pulmonary/Chest: Breath sounds normal. No stridor. No respiratory distress. He has no wheezes. He has no rales.  Musculoskeletal: He exhibits no edema.  Lymphadenopathy:       Head (right side): No tonsillar adenopathy present.       Head (left side): No tonsillar adenopathy present.    He has no cervical adenopathy.  Neurological: He is alert.  Skin: No rash noted. He is not diaphoretic. No erythema. Nails show no clubbing.    Diagnostics:    Spirometry was performed and demonstrated an FEV1 of 3.34 at 72 % of predicted.  The patient had an Asthma Control Test with the following results: ACT Total Score: 19.    Results of blood tests obtained 27 April 2017 identified normal renal function, AST 62 IU/L,  ALT 143 IU/L, WBC 10.6, absolute eosinophil 200, absolute lymphocyte 2400, hemoglobin 15.6, platelet 310, TSH 2.63 uIU/mL, T4 6.4 UG/DL, thyroid peroxidase antibody 12 IU/mL, negative ANA, LK M antibody 3.8 units, antimitochondrial antibody less than 20 units, anti-smooth muscle antibody 9 units, negative hepatitis B surface antigen, negative hepatitis C antibody, negative HIV antibody.  Assessment and Plan:   1. Not well controlled moderate persistent asthma   2. Allergic rhinoconjunctivitis   3. LPRD (laryngopharyngeal reflux disease)   4. Pruritic disorder   5. Elevated liver function tests   6. Light tobacco smoker <10 cigarettes per day   7. Allergic rhinitis, unspecified seasonality, unspecified trigger   8. Thrush     1.  Perform allergen avoidance measures directed against house dust mite and animals as best as possible  2.  Treat and prevent inflammation:   A.  QVAR 80 Redihaler - 2 inhalations twice a day  B.  OTC Nasacort 1 spray each nostril 1 time per day  3.  Treat and prevent reflux:   A.  Consolidate caffeine and alcohol consumption  B.  OTC Ranitidine 150 1 tablet twice a day  4.  Treat and prevent itchiness:   A.  Xyzal 5 mg twice a day  5. If needed:   A. Proair HFA or similar - 2 inhalations every 4-6 hours  B. Nasal saline  5.  Continue immunotherapy and EpiPen / Auvi-Q  6.  Treat fungus with Diflucan 150 mg tablet today and Friday and Sunday   7. Use nicotine substitutes to replace tobacco smoke exposure  8. Return to clinic 4 weeks or earlier if problem  Although Gregory Lambert is better in some regard he still continues to have respiratory tract symptoms and I made the suggestion today that he consider using a nicotine substitutes to eliminate his tobacco smoke exposure to his airway.  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  We need to place him on some form of anti-inflammatory  agent for his lower respiratory tract and will try Qvar with the hope that this will not irritate his mouth.  I made the suggestion that he consistently use all of the other medications directed against inflammation and reflux as noted above and continue on immunotherapy.  I will see him back in his clinic in 4 weeks or earlier if there is a problem.  Laurette SchimkeEric Saoirse Legere, MD Allergy / Immunology [facility]

## 2017-05-26 NOTE — Patient Instructions (Addendum)
  1.  Perform allergen avoidance measures directed against house dust mite and animals as best as possible  2.  Treat and prevent inflammation:   A.  QVAR 80 Redihaler - 2 inhalations twice a day  B.  OTC Nasacort 1 spray each nostril 1 time per day  3.  Treat and prevent reflux:   A.  Consolidate caffeine and alcohol consumption  B.  OTC Ranitidine 150 1 tablet twice a day  4.  Treat and prevent itchiness:   A.  Xyzal 5 mg twice a day  5. If needed:   A. Proair HFA or similar - 2 inhalations every 4-6 hours  B. Nasal saline  5.  Continue immunotherapy and EpiPen / Auvi-Q  6.  Treat fungus with Diflucan 150 mg tablet today and Friday and Sunday   7. Use nicotine substitutes to replace tobacco smoke exposure  8. Return to clinic 4 weeks or earlier if problem

## 2017-05-27 ENCOUNTER — Encounter: Payer: Self-pay | Admitting: Allergy and Immunology

## 2017-05-31 ENCOUNTER — Ambulatory Visit (INDEPENDENT_AMBULATORY_CARE_PROVIDER_SITE_OTHER): Payer: 59 | Admitting: *Deleted

## 2017-05-31 DIAGNOSIS — J309 Allergic rhinitis, unspecified: Secondary | ICD-10-CM | POA: Diagnosis not present

## 2017-06-03 ENCOUNTER — Ambulatory Visit (INDEPENDENT_AMBULATORY_CARE_PROVIDER_SITE_OTHER): Payer: 59 | Admitting: *Deleted

## 2017-06-03 DIAGNOSIS — J309 Allergic rhinitis, unspecified: Secondary | ICD-10-CM

## 2017-06-07 ENCOUNTER — Ambulatory Visit (INDEPENDENT_AMBULATORY_CARE_PROVIDER_SITE_OTHER): Payer: 59 | Admitting: *Deleted

## 2017-06-07 DIAGNOSIS — J309 Allergic rhinitis, unspecified: Secondary | ICD-10-CM | POA: Diagnosis not present

## 2017-06-09 ENCOUNTER — Encounter: Payer: Self-pay | Admitting: *Deleted

## 2017-06-10 ENCOUNTER — Ambulatory Visit (INDEPENDENT_AMBULATORY_CARE_PROVIDER_SITE_OTHER): Payer: 59 | Admitting: *Deleted

## 2017-06-10 DIAGNOSIS — J309 Allergic rhinitis, unspecified: Secondary | ICD-10-CM | POA: Diagnosis not present

## 2017-06-14 ENCOUNTER — Ambulatory Visit (INDEPENDENT_AMBULATORY_CARE_PROVIDER_SITE_OTHER): Payer: 59 | Admitting: *Deleted

## 2017-06-14 DIAGNOSIS — J309 Allergic rhinitis, unspecified: Secondary | ICD-10-CM | POA: Diagnosis not present

## 2017-06-21 ENCOUNTER — Ambulatory Visit (INDEPENDENT_AMBULATORY_CARE_PROVIDER_SITE_OTHER): Payer: 59 | Admitting: *Deleted

## 2017-06-21 DIAGNOSIS — J309 Allergic rhinitis, unspecified: Secondary | ICD-10-CM

## 2017-06-28 ENCOUNTER — Ambulatory Visit (INDEPENDENT_AMBULATORY_CARE_PROVIDER_SITE_OTHER): Payer: 59 | Admitting: *Deleted

## 2017-06-28 DIAGNOSIS — J309 Allergic rhinitis, unspecified: Secondary | ICD-10-CM | POA: Diagnosis not present

## 2017-07-08 ENCOUNTER — Ambulatory Visit (INDEPENDENT_AMBULATORY_CARE_PROVIDER_SITE_OTHER): Payer: 59 | Admitting: *Deleted

## 2017-07-08 DIAGNOSIS — J309 Allergic rhinitis, unspecified: Secondary | ICD-10-CM | POA: Diagnosis not present

## 2017-07-15 ENCOUNTER — Ambulatory Visit (INDEPENDENT_AMBULATORY_CARE_PROVIDER_SITE_OTHER): Payer: 59 | Admitting: *Deleted

## 2017-07-15 DIAGNOSIS — J309 Allergic rhinitis, unspecified: Secondary | ICD-10-CM

## 2017-07-20 ENCOUNTER — Ambulatory Visit: Payer: 59 | Admitting: Allergy

## 2017-07-20 DIAGNOSIS — J309 Allergic rhinitis, unspecified: Secondary | ICD-10-CM

## 2017-07-22 ENCOUNTER — Ambulatory Visit (INDEPENDENT_AMBULATORY_CARE_PROVIDER_SITE_OTHER): Payer: 59 | Admitting: *Deleted

## 2017-07-22 DIAGNOSIS — J309 Allergic rhinitis, unspecified: Secondary | ICD-10-CM

## 2017-07-26 ENCOUNTER — Ambulatory Visit (INDEPENDENT_AMBULATORY_CARE_PROVIDER_SITE_OTHER): Payer: 59 | Admitting: *Deleted

## 2017-07-26 DIAGNOSIS — J309 Allergic rhinitis, unspecified: Secondary | ICD-10-CM | POA: Diagnosis not present

## 2017-07-30 DIAGNOSIS — J301 Allergic rhinitis due to pollen: Secondary | ICD-10-CM | POA: Diagnosis not present

## 2017-08-02 ENCOUNTER — Ambulatory Visit (INDEPENDENT_AMBULATORY_CARE_PROVIDER_SITE_OTHER): Payer: 59 | Admitting: *Deleted

## 2017-08-02 DIAGNOSIS — J309 Allergic rhinitis, unspecified: Secondary | ICD-10-CM | POA: Diagnosis not present

## 2017-08-16 ENCOUNTER — Ambulatory Visit (INDEPENDENT_AMBULATORY_CARE_PROVIDER_SITE_OTHER): Payer: 59 | Admitting: *Deleted

## 2017-08-16 DIAGNOSIS — J309 Allergic rhinitis, unspecified: Secondary | ICD-10-CM

## 2017-08-23 ENCOUNTER — Ambulatory Visit (INDEPENDENT_AMBULATORY_CARE_PROVIDER_SITE_OTHER): Payer: 59 | Admitting: *Deleted

## 2017-08-23 DIAGNOSIS — J309 Allergic rhinitis, unspecified: Secondary | ICD-10-CM

## 2017-08-27 ENCOUNTER — Ambulatory Visit (INDEPENDENT_AMBULATORY_CARE_PROVIDER_SITE_OTHER): Payer: 59 | Admitting: *Deleted

## 2017-08-27 DIAGNOSIS — J309 Allergic rhinitis, unspecified: Secondary | ICD-10-CM | POA: Diagnosis not present

## 2017-09-06 ENCOUNTER — Ambulatory Visit (INDEPENDENT_AMBULATORY_CARE_PROVIDER_SITE_OTHER): Payer: 59 | Admitting: *Deleted

## 2017-09-06 DIAGNOSIS — J309 Allergic rhinitis, unspecified: Secondary | ICD-10-CM | POA: Diagnosis not present

## 2017-09-13 ENCOUNTER — Ambulatory Visit (INDEPENDENT_AMBULATORY_CARE_PROVIDER_SITE_OTHER): Payer: 59 | Admitting: *Deleted

## 2017-09-13 DIAGNOSIS — J309 Allergic rhinitis, unspecified: Secondary | ICD-10-CM

## 2017-09-20 ENCOUNTER — Ambulatory Visit (INDEPENDENT_AMBULATORY_CARE_PROVIDER_SITE_OTHER): Payer: 59 | Admitting: *Deleted

## 2017-09-20 DIAGNOSIS — J309 Allergic rhinitis, unspecified: Secondary | ICD-10-CM | POA: Diagnosis not present

## 2017-09-27 ENCOUNTER — Ambulatory Visit (INDEPENDENT_AMBULATORY_CARE_PROVIDER_SITE_OTHER): Payer: 59 | Admitting: *Deleted

## 2017-09-27 DIAGNOSIS — J309 Allergic rhinitis, unspecified: Secondary | ICD-10-CM

## 2017-10-04 ENCOUNTER — Ambulatory Visit (INDEPENDENT_AMBULATORY_CARE_PROVIDER_SITE_OTHER): Payer: 59 | Admitting: *Deleted

## 2017-10-04 DIAGNOSIS — J309 Allergic rhinitis, unspecified: Secondary | ICD-10-CM | POA: Diagnosis not present

## 2017-10-06 ENCOUNTER — Encounter: Payer: Self-pay | Admitting: *Deleted

## 2017-10-06 DIAGNOSIS — J301 Allergic rhinitis due to pollen: Secondary | ICD-10-CM

## 2017-10-06 NOTE — Progress Notes (Signed)
Vials made. Exp: 10-07-18. hv 

## 2017-10-15 ENCOUNTER — Ambulatory Visit (INDEPENDENT_AMBULATORY_CARE_PROVIDER_SITE_OTHER): Payer: 59 | Admitting: *Deleted

## 2017-10-15 DIAGNOSIS — J309 Allergic rhinitis, unspecified: Secondary | ICD-10-CM

## 2017-10-22 ENCOUNTER — Ambulatory Visit (INDEPENDENT_AMBULATORY_CARE_PROVIDER_SITE_OTHER): Payer: PRIVATE HEALTH INSURANCE | Admitting: *Deleted

## 2017-10-22 DIAGNOSIS — J309 Allergic rhinitis, unspecified: Secondary | ICD-10-CM | POA: Diagnosis not present

## 2017-10-28 ENCOUNTER — Ambulatory Visit (INDEPENDENT_AMBULATORY_CARE_PROVIDER_SITE_OTHER): Payer: PRIVATE HEALTH INSURANCE | Admitting: *Deleted

## 2017-10-28 DIAGNOSIS — J309 Allergic rhinitis, unspecified: Secondary | ICD-10-CM | POA: Diagnosis not present

## 2017-11-05 ENCOUNTER — Ambulatory Visit (INDEPENDENT_AMBULATORY_CARE_PROVIDER_SITE_OTHER): Payer: PRIVATE HEALTH INSURANCE | Admitting: *Deleted

## 2017-11-05 DIAGNOSIS — J309 Allergic rhinitis, unspecified: Secondary | ICD-10-CM | POA: Diagnosis not present

## 2017-11-12 ENCOUNTER — Ambulatory Visit (INDEPENDENT_AMBULATORY_CARE_PROVIDER_SITE_OTHER): Payer: PRIVATE HEALTH INSURANCE | Admitting: *Deleted

## 2017-11-12 DIAGNOSIS — J309 Allergic rhinitis, unspecified: Secondary | ICD-10-CM

## 2017-11-18 ENCOUNTER — Ambulatory Visit (INDEPENDENT_AMBULATORY_CARE_PROVIDER_SITE_OTHER): Payer: PRIVATE HEALTH INSURANCE | Admitting: *Deleted

## 2017-11-18 DIAGNOSIS — J309 Allergic rhinitis, unspecified: Secondary | ICD-10-CM | POA: Diagnosis not present

## 2017-11-26 ENCOUNTER — Ambulatory Visit (INDEPENDENT_AMBULATORY_CARE_PROVIDER_SITE_OTHER): Payer: PRIVATE HEALTH INSURANCE | Admitting: *Deleted

## 2017-11-26 DIAGNOSIS — J309 Allergic rhinitis, unspecified: Secondary | ICD-10-CM

## 2017-12-03 ENCOUNTER — Ambulatory Visit (INDEPENDENT_AMBULATORY_CARE_PROVIDER_SITE_OTHER): Payer: PRIVATE HEALTH INSURANCE | Admitting: *Deleted

## 2017-12-03 DIAGNOSIS — J309 Allergic rhinitis, unspecified: Secondary | ICD-10-CM | POA: Diagnosis not present

## 2017-12-10 ENCOUNTER — Ambulatory Visit (INDEPENDENT_AMBULATORY_CARE_PROVIDER_SITE_OTHER): Payer: PRIVATE HEALTH INSURANCE | Admitting: *Deleted

## 2017-12-10 DIAGNOSIS — J309 Allergic rhinitis, unspecified: Secondary | ICD-10-CM

## 2017-12-17 ENCOUNTER — Ambulatory Visit (INDEPENDENT_AMBULATORY_CARE_PROVIDER_SITE_OTHER): Payer: PRIVATE HEALTH INSURANCE | Admitting: *Deleted

## 2017-12-17 DIAGNOSIS — J309 Allergic rhinitis, unspecified: Secondary | ICD-10-CM

## 2017-12-24 ENCOUNTER — Ambulatory Visit (INDEPENDENT_AMBULATORY_CARE_PROVIDER_SITE_OTHER): Payer: PRIVATE HEALTH INSURANCE | Admitting: *Deleted

## 2017-12-24 DIAGNOSIS — J309 Allergic rhinitis, unspecified: Secondary | ICD-10-CM

## 2017-12-28 DIAGNOSIS — J301 Allergic rhinitis due to pollen: Secondary | ICD-10-CM | POA: Diagnosis not present

## 2017-12-28 NOTE — Progress Notes (Signed)
Vials exp 12-29-18 

## 2017-12-30 ENCOUNTER — Ambulatory Visit (INDEPENDENT_AMBULATORY_CARE_PROVIDER_SITE_OTHER): Payer: PRIVATE HEALTH INSURANCE | Admitting: *Deleted

## 2017-12-30 DIAGNOSIS — J309 Allergic rhinitis, unspecified: Secondary | ICD-10-CM

## 2018-01-12 ENCOUNTER — Ambulatory Visit (INDEPENDENT_AMBULATORY_CARE_PROVIDER_SITE_OTHER): Payer: PRIVATE HEALTH INSURANCE | Admitting: *Deleted

## 2018-01-12 DIAGNOSIS — J309 Allergic rhinitis, unspecified: Secondary | ICD-10-CM | POA: Diagnosis not present

## 2018-01-28 ENCOUNTER — Ambulatory Visit (INDEPENDENT_AMBULATORY_CARE_PROVIDER_SITE_OTHER): Payer: PRIVATE HEALTH INSURANCE | Admitting: *Deleted

## 2018-01-28 DIAGNOSIS — J309 Allergic rhinitis, unspecified: Secondary | ICD-10-CM | POA: Diagnosis not present

## 2018-02-07 ENCOUNTER — Ambulatory Visit (INDEPENDENT_AMBULATORY_CARE_PROVIDER_SITE_OTHER): Payer: PRIVATE HEALTH INSURANCE

## 2018-02-07 DIAGNOSIS — J309 Allergic rhinitis, unspecified: Secondary | ICD-10-CM | POA: Diagnosis not present

## 2018-02-23 ENCOUNTER — Encounter: Payer: Self-pay | Admitting: Gastroenterology

## 2018-02-24 ENCOUNTER — Telehealth: Payer: Self-pay | Admitting: Gastroenterology

## 2018-02-24 NOTE — Telephone Encounter (Signed)
Can work him in into my afternoon clinic on Friday But, please make sure that we have CT report, labs, records from ER at [facility]. If we do not have records, then please work him in on Monday

## 2018-02-24 NOTE — Telephone Encounter (Signed)
Called and spoke with patient-patient reports he has had watery stool since 02/20/2018 and has been using Miralax for the last 3 days, was seen in the ER at [facility] where they did a xray, CT scan and prescribed Magnesium Citrate and an enema; patient reports no relief from these measures and would like to be seen ASAP for the "blockage I think I have because it feels like something is stuck up in me and I just keep pooping water"; patient is requesting to be seen as soon as possible; front desk offered the patient to be seen on Monday by an APP however patient is "not sure if I can wait until then I may have to go back to the emergency department if Dr. Chales AbrahamsGupta cannot see me on Friday";   Please advise -can the patient be "worked" in to your afternoon clinic time or would you like for the patient to be scheduled to see an APP=unsure who/what times are available for Monday;

## 2018-02-24 NOTE — Telephone Encounter (Signed)
Pt is a pt of Dr. Chales Abrahams from [facility]. Pt would like a CB to sched ASAP for constipation and abd pain.

## 2018-02-25 NOTE — Telephone Encounter (Signed)
I will send over the request for records. It will probably by Tuesday before we can get him in to be seen, we do not have clinic on Monday.

## 2018-02-25 NOTE — Telephone Encounter (Signed)
Gregory Lambert, here is the patient that if we can double book then we need to get him worked in IF we can get the medical records-can you see if we can please expedite obtaining these records? And then notify the patient of the appt?

## 2018-02-25 NOTE — Telephone Encounter (Signed)
Dr. Chales Abrahams is aware that we will not be able to see this patient until Tuesday. He ask that we put it on one one of the extenders schedule. Patient is scheduled on 02/28/18 at 2:30pm with Amy Esterwood. Patient is aware of this appointment. I have requested records, when they come back I will fax to [facility].

## 2018-02-28 ENCOUNTER — Ambulatory Visit: Payer: Self-pay | Admitting: Physician Assistant

## 2018-03-04 ENCOUNTER — Ambulatory Visit (INDEPENDENT_AMBULATORY_CARE_PROVIDER_SITE_OTHER): Payer: PRIVATE HEALTH INSURANCE | Admitting: *Deleted

## 2018-03-04 DIAGNOSIS — J309 Allergic rhinitis, unspecified: Secondary | ICD-10-CM | POA: Diagnosis not present

## 2018-03-18 ENCOUNTER — Ambulatory Visit (INDEPENDENT_AMBULATORY_CARE_PROVIDER_SITE_OTHER): Payer: PRIVATE HEALTH INSURANCE | Admitting: *Deleted

## 2018-03-18 DIAGNOSIS — J309 Allergic rhinitis, unspecified: Secondary | ICD-10-CM | POA: Diagnosis not present

## 2018-03-24 ENCOUNTER — Ambulatory Visit (INDEPENDENT_AMBULATORY_CARE_PROVIDER_SITE_OTHER): Payer: PRIVATE HEALTH INSURANCE | Admitting: *Deleted

## 2018-03-24 DIAGNOSIS — J309 Allergic rhinitis, unspecified: Secondary | ICD-10-CM | POA: Diagnosis not present

## 2018-04-01 ENCOUNTER — Ambulatory Visit (INDEPENDENT_AMBULATORY_CARE_PROVIDER_SITE_OTHER): Payer: PRIVATE HEALTH INSURANCE | Admitting: *Deleted

## 2018-04-01 DIAGNOSIS — J309 Allergic rhinitis, unspecified: Secondary | ICD-10-CM

## 2018-04-15 ENCOUNTER — Ambulatory Visit (INDEPENDENT_AMBULATORY_CARE_PROVIDER_SITE_OTHER): Payer: PRIVATE HEALTH INSURANCE | Admitting: *Deleted

## 2018-04-15 DIAGNOSIS — J309 Allergic rhinitis, unspecified: Secondary | ICD-10-CM | POA: Diagnosis not present

## 2018-04-22 ENCOUNTER — Ambulatory Visit (INDEPENDENT_AMBULATORY_CARE_PROVIDER_SITE_OTHER): Payer: PRIVATE HEALTH INSURANCE | Admitting: *Deleted

## 2018-04-22 DIAGNOSIS — J309 Allergic rhinitis, unspecified: Secondary | ICD-10-CM

## 2018-04-28 ENCOUNTER — Ambulatory Visit (INDEPENDENT_AMBULATORY_CARE_PROVIDER_SITE_OTHER): Payer: PRIVATE HEALTH INSURANCE | Admitting: *Deleted

## 2018-04-28 DIAGNOSIS — J309 Allergic rhinitis, unspecified: Secondary | ICD-10-CM

## 2018-05-16 ENCOUNTER — Ambulatory Visit (INDEPENDENT_AMBULATORY_CARE_PROVIDER_SITE_OTHER): Payer: PRIVATE HEALTH INSURANCE | Admitting: *Deleted

## 2018-05-16 DIAGNOSIS — J309 Allergic rhinitis, unspecified: Secondary | ICD-10-CM

## 2018-05-24 DIAGNOSIS — J301 Allergic rhinitis due to pollen: Secondary | ICD-10-CM

## 2018-05-24 NOTE — Progress Notes (Signed)
VIALS EXP 05-24-2019 

## 2018-06-06 ENCOUNTER — Ambulatory Visit (INDEPENDENT_AMBULATORY_CARE_PROVIDER_SITE_OTHER): Payer: PRIVATE HEALTH INSURANCE | Admitting: *Deleted

## 2018-06-06 DIAGNOSIS — J309 Allergic rhinitis, unspecified: Secondary | ICD-10-CM | POA: Diagnosis not present

## 2018-06-17 ENCOUNTER — Ambulatory Visit (INDEPENDENT_AMBULATORY_CARE_PROVIDER_SITE_OTHER): Payer: PRIVATE HEALTH INSURANCE | Admitting: *Deleted

## 2018-06-17 DIAGNOSIS — J309 Allergic rhinitis, unspecified: Secondary | ICD-10-CM | POA: Diagnosis not present

## 2018-07-01 ENCOUNTER — Ambulatory Visit (INDEPENDENT_AMBULATORY_CARE_PROVIDER_SITE_OTHER): Payer: PRIVATE HEALTH INSURANCE | Admitting: *Deleted

## 2018-07-01 DIAGNOSIS — J309 Allergic rhinitis, unspecified: Secondary | ICD-10-CM | POA: Diagnosis not present

## 2018-07-15 ENCOUNTER — Ambulatory Visit (INDEPENDENT_AMBULATORY_CARE_PROVIDER_SITE_OTHER): Payer: PRIVATE HEALTH INSURANCE | Admitting: *Deleted

## 2018-07-15 DIAGNOSIS — J309 Allergic rhinitis, unspecified: Secondary | ICD-10-CM

## 2018-08-01 ENCOUNTER — Ambulatory Visit (INDEPENDENT_AMBULATORY_CARE_PROVIDER_SITE_OTHER): Payer: PRIVATE HEALTH INSURANCE

## 2018-08-01 DIAGNOSIS — J309 Allergic rhinitis, unspecified: Secondary | ICD-10-CM

## 2018-08-22 ENCOUNTER — Ambulatory Visit (INDEPENDENT_AMBULATORY_CARE_PROVIDER_SITE_OTHER): Payer: PRIVATE HEALTH INSURANCE | Admitting: *Deleted

## 2018-08-22 DIAGNOSIS — J309 Allergic rhinitis, unspecified: Secondary | ICD-10-CM | POA: Diagnosis not present

## 2018-09-05 ENCOUNTER — Ambulatory Visit (INDEPENDENT_AMBULATORY_CARE_PROVIDER_SITE_OTHER): Payer: PRIVATE HEALTH INSURANCE | Admitting: *Deleted

## 2018-09-05 DIAGNOSIS — J309 Allergic rhinitis, unspecified: Secondary | ICD-10-CM

## 2018-09-16 ENCOUNTER — Ambulatory Visit (INDEPENDENT_AMBULATORY_CARE_PROVIDER_SITE_OTHER): Payer: PRIVATE HEALTH INSURANCE | Admitting: *Deleted

## 2018-09-16 DIAGNOSIS — J309 Allergic rhinitis, unspecified: Secondary | ICD-10-CM | POA: Diagnosis not present

## 2018-09-29 ENCOUNTER — Ambulatory Visit (INDEPENDENT_AMBULATORY_CARE_PROVIDER_SITE_OTHER): Payer: PRIVATE HEALTH INSURANCE | Admitting: *Deleted

## 2018-09-29 DIAGNOSIS — J309 Allergic rhinitis, unspecified: Secondary | ICD-10-CM

## 2018-10-20 ENCOUNTER — Ambulatory Visit (INDEPENDENT_AMBULATORY_CARE_PROVIDER_SITE_OTHER): Payer: PRIVATE HEALTH INSURANCE | Admitting: *Deleted

## 2018-10-20 DIAGNOSIS — J309 Allergic rhinitis, unspecified: Secondary | ICD-10-CM

## 2018-10-27 ENCOUNTER — Ambulatory Visit (INDEPENDENT_AMBULATORY_CARE_PROVIDER_SITE_OTHER): Payer: PRIVATE HEALTH INSURANCE | Admitting: *Deleted

## 2018-10-27 DIAGNOSIS — J309 Allergic rhinitis, unspecified: Secondary | ICD-10-CM

## 2018-11-14 ENCOUNTER — Ambulatory Visit (INDEPENDENT_AMBULATORY_CARE_PROVIDER_SITE_OTHER): Payer: PRIVATE HEALTH INSURANCE | Admitting: *Deleted

## 2018-11-14 DIAGNOSIS — J309 Allergic rhinitis, unspecified: Secondary | ICD-10-CM

## 2018-12-13 ENCOUNTER — Ambulatory Visit (INDEPENDENT_AMBULATORY_CARE_PROVIDER_SITE_OTHER): Payer: PRIVATE HEALTH INSURANCE | Admitting: *Deleted

## 2018-12-13 DIAGNOSIS — J309 Allergic rhinitis, unspecified: Secondary | ICD-10-CM | POA: Diagnosis not present

## 2018-12-22 ENCOUNTER — Ambulatory Visit (INDEPENDENT_AMBULATORY_CARE_PROVIDER_SITE_OTHER): Payer: PRIVATE HEALTH INSURANCE | Admitting: *Deleted

## 2018-12-22 DIAGNOSIS — J309 Allergic rhinitis, unspecified: Secondary | ICD-10-CM | POA: Diagnosis not present

## 2019-01-06 ENCOUNTER — Ambulatory Visit (INDEPENDENT_AMBULATORY_CARE_PROVIDER_SITE_OTHER): Payer: PRIVATE HEALTH INSURANCE

## 2019-01-06 DIAGNOSIS — J309 Allergic rhinitis, unspecified: Secondary | ICD-10-CM

## 2019-01-26 ENCOUNTER — Ambulatory Visit (INDEPENDENT_AMBULATORY_CARE_PROVIDER_SITE_OTHER): Payer: PRIVATE HEALTH INSURANCE

## 2019-01-26 DIAGNOSIS — J309 Allergic rhinitis, unspecified: Secondary | ICD-10-CM

## 2019-02-07 DIAGNOSIS — J301 Allergic rhinitis due to pollen: Secondary | ICD-10-CM | POA: Diagnosis not present

## 2019-02-07 NOTE — Progress Notes (Signed)
Vials exp 02-07-20 

## 2019-02-16 ENCOUNTER — Encounter: Payer: Self-pay | Admitting: Allergy and Immunology

## 2019-02-20 ENCOUNTER — Ambulatory Visit (INDEPENDENT_AMBULATORY_CARE_PROVIDER_SITE_OTHER): Payer: PRIVATE HEALTH INSURANCE | Admitting: *Deleted

## 2019-02-20 DIAGNOSIS — J309 Allergic rhinitis, unspecified: Secondary | ICD-10-CM | POA: Diagnosis not present

## 2019-03-03 ENCOUNTER — Ambulatory Visit (INDEPENDENT_AMBULATORY_CARE_PROVIDER_SITE_OTHER): Payer: PRIVATE HEALTH INSURANCE | Admitting: *Deleted

## 2019-03-03 DIAGNOSIS — J309 Allergic rhinitis, unspecified: Secondary | ICD-10-CM | POA: Diagnosis not present

## 2019-03-13 ENCOUNTER — Ambulatory Visit: Payer: PRIVATE HEALTH INSURANCE | Admitting: Allergy and Immunology

## 2019-03-29 ENCOUNTER — Ambulatory Visit (INDEPENDENT_AMBULATORY_CARE_PROVIDER_SITE_OTHER): Payer: PRIVATE HEALTH INSURANCE

## 2019-03-29 DIAGNOSIS — J309 Allergic rhinitis, unspecified: Secondary | ICD-10-CM

## 2019-04-05 ENCOUNTER — Ambulatory Visit: Payer: PRIVATE HEALTH INSURANCE | Admitting: Allergy and Immunology

## 2019-04-19 ENCOUNTER — Ambulatory Visit (INDEPENDENT_AMBULATORY_CARE_PROVIDER_SITE_OTHER): Payer: PRIVATE HEALTH INSURANCE | Admitting: *Deleted

## 2019-04-19 DIAGNOSIS — J309 Allergic rhinitis, unspecified: Secondary | ICD-10-CM | POA: Diagnosis not present

## 2019-04-26 ENCOUNTER — Encounter: Payer: Self-pay | Admitting: Allergy and Immunology

## 2019-04-26 ENCOUNTER — Ambulatory Visit (INDEPENDENT_AMBULATORY_CARE_PROVIDER_SITE_OTHER): Payer: PRIVATE HEALTH INSURANCE | Admitting: Allergy and Immunology

## 2019-04-26 ENCOUNTER — Other Ambulatory Visit: Payer: Self-pay

## 2019-04-26 VITALS — BP 132/98 | HR 76 | Temp 97.9°F | Resp 12 | Ht 72.0 in | Wt 219.4 lb

## 2019-04-26 DIAGNOSIS — J309 Allergic rhinitis, unspecified: Secondary | ICD-10-CM

## 2019-04-26 DIAGNOSIS — K709 Alcoholic liver disease, unspecified: Secondary | ICD-10-CM

## 2019-04-26 DIAGNOSIS — L299 Pruritus, unspecified: Secondary | ICD-10-CM

## 2019-04-26 DIAGNOSIS — F17210 Nicotine dependence, cigarettes, uncomplicated: Secondary | ICD-10-CM | POA: Diagnosis not present

## 2019-04-26 DIAGNOSIS — K219 Gastro-esophageal reflux disease without esophagitis: Secondary | ICD-10-CM | POA: Diagnosis not present

## 2019-04-26 DIAGNOSIS — H1010 Acute atopic conjunctivitis, unspecified eye: Secondary | ICD-10-CM

## 2019-04-26 DIAGNOSIS — R7989 Other specified abnormal findings of blood chemistry: Secondary | ICD-10-CM

## 2019-04-26 DIAGNOSIS — J4520 Mild intermittent asthma, uncomplicated: Secondary | ICD-10-CM

## 2019-04-26 MED ORDER — ALBUTEROL SULFATE HFA 108 (90 BASE) MCG/ACT IN AERS: INHALATION_SPRAY | RESPIRATORY_TRACT | 1 refills | Status: DC

## 2019-04-26 NOTE — Progress Notes (Signed)
[facility]   Follow-up Note  Referring Provider: Charlynn Court, NP Primary Provider: Charlynn Court, NP Date of Office Visit: 04/26/2019  Subjective:   Gregory Lambert (DOB: 1990-09-18) is a 29 y.o. male who returns to the [facility] on 04/26/2019 in re-evaluation of the following:  HPI: Praneeth presents to this clinic in evaluation of asthma and allergic rhinitis and LPR and pruritic disorder and elevated liver function tests.  His last visit to this clinic was 25 September 2017.  Overall he has done very well with his airway and it does not sound as though he has required a systemic steroid or antibiotic for any type of airway issue and rarely uses a short acting bronchodilator.  He had very little problems with his nose.  He uses immunotherapy currently at every week without any adverse effect and occasionally takes some nasal steroids.  He still continues to smoke about 1 pack of cigarettes per week.  His reflux has been under very good control with intermittent use of omeprazole at this point.  He still continues to drink significant amounts of alcohol throughout the week averaging out to 1 case of beer per week.  His pruritic disorder has really modified quite significantly and he now intermittently uses some Allegra.  He informs me that his elevated liver function tests are still elevated although "not that bad".  He does not really know the numbers of his elevations.  Previous evaluation for the source of his elevated liver function test did not identify any significant autoimmune disease or infectious disease and this is probably on the basis of his alcohol consumption.  He has not received the flu vaccine and needs not sure about receiving the Covid vaccine.  Allergies as of 04/26/2019      Reactions   Montelukast    Gave a weird feeling   Prednisone Palpitations      Medication List    Auvi-Q 0.3 mg/0.3 mL Soaj  injection Generic drug: EPINEPHrine Use as directed for life-threatening allergic reaction.   fexofenadine 180 MG tablet Commonly known as: ALLEGRA Take 180 mg by mouth daily.   fluticasone 50 MCG/ACT nasal spray Commonly known as: FLONASE Place 1 spray into both nostrils daily as needed for allergies.       Past Medical History:  Diagnosis Date  . Anxiety   . Asthma   . Sinusitis   . Urticaria     Past Surgical History:  Procedure Laterality Date  . CHOLECYSTECTOMY    . TONSILLECTOMY    . WISDOM TOOTH EXTRACTION      Review of systems negative except as noted in HPI / PMHx or noted below:  Review of Systems  Constitutional: Negative.   HENT: Negative.   Eyes: Negative.   Respiratory: Negative.   Cardiovascular: Negative.   Gastrointestinal: Negative.   Genitourinary: Negative.   Musculoskeletal: Negative.   Skin: Negative.   Neurological: Negative.   Endo/Heme/Allergies: Negative.   Psychiatric/Behavioral: Negative.      Objective:   Vitals:   04/26/19 1551  BP: (!) 132/98  Pulse: 76  Resp: 12  Temp: 97.9 F (36.6 C)  SpO2: 97%   Height: 6' (182.9 cm)  Weight: 219 lb 6.4 oz (99.5 kg)   Physical Exam Constitutional:      Appearance: He is not diaphoretic.  HENT:     Head: Normocephalic.     Right Ear: Tympanic membrane, ear canal  and external ear normal.     Left Ear: Tympanic membrane, ear canal and external ear normal.     Nose: Nose normal. No mucosal edema or rhinorrhea.     Mouth/Throat:     Pharynx: Uvula midline. No oropharyngeal exudate.  Eyes:     Conjunctiva/sclera: Conjunctivae normal.  Neck:     Thyroid: No thyromegaly.     Trachea: Trachea normal. No tracheal tenderness or tracheal deviation.  Cardiovascular:     Rate and Rhythm: Normal rate and regular rhythm.     Heart sounds: Normal heart sounds, S1 normal and S2 normal. No murmur.  Pulmonary:     Effort: No respiratory distress.     Breath sounds: Normal breath sounds.  No stridor. No wheezing or rales.  Lymphadenopathy:     Head:     Right side of head: No tonsillar adenopathy.     Left side of head: No tonsillar adenopathy.     Cervical: No cervical adenopathy.  Skin:    Findings: No erythema or rash.     Nails: There is no clubbing.  Neurological:     Mental Status: He is alert.     Diagnostics:    Spirometry was performed and demonstrated an FEV1 of 3.61 at 76 % of predicted.  Assessment and Plan:   1. Asthma, mild intermittent, well-controlled   2. Light tobacco smoker <10 cigarettes per day   3. Allergic rhinoconjunctivitis   4. Gastroesophageal reflux disease, unspecified whether esophagitis present   5. Elevated liver function tests   6. Pruritic disorder   7. Alcohol induced liver disorder (HCC)     1.  Perform allergen avoidance measures directed against house dust mite and animals as best as possible  2.  Treat and prevent inflammation:   A.  OTC Nasacort / Flonase 1-2 sprays each nostril 1 time per day  3.  Treat and prevent reflux:   A.  Consolidate caffeine and alcohol consumption  B.  OTC Omeprazole 20 mg - 1 tablet 1 time per day if needed  4.  Treat and prevent itchiness:   A.  Allegra 180 - 1 tablet 1 time per day if needed  5. If needed:   A. Proair HFA or similar - 2 inhalations every 4-6 hours  B. Nasal saline  5.  Continue immunotherapy and EpiPen / Auvi-Q   6. Consider nicotine substitutes to replace tobacco smoke exposure  7. Return to clinic 1 year or earlier if problem  Deven appears to be doing quite well on his current therapy and I believe that he would even do better if he did not smoke and does not drink to the extent that he does at this point in time.  He will continue on his immunotherapy which appears to be going quite well.  He will continue to utilize an anti-inflammatory agents for his airway as needed and therapy for his reflux as needed.  Assuming he does well with this plan I will see  him back in his clinic in 1 year or earlier.  We are are not going to assess his elevated liver function test any further given his negative diagnostic evaluation performed in the past and I did inform him today that his elevated liver function tests are probably on the basis of his alcohol consumption at this point and he is at risk of developing cirrhosis with long-term alcohol use.  Laurette Schimke, MD Allergy / Immunology [facility]

## 2019-04-26 NOTE — Patient Instructions (Addendum)
  1.  Perform allergen avoidance measures directed against house dust mite and animals as best as possible  2.  Treat and prevent inflammation:   A.  OTC Nasacort / Flonase 1-2 sprays each nostril 1 time per day  3.  Treat and prevent reflux:   A.  Consolidate caffeine and alcohol consumption  B.  OTC Omeprazole 20 mg - 1 tablet 1 time per day if needed  4.  Treat and prevent itchiness:   A.  Allegra 180 - 1 tablet 1 time per day if needed  5. If needed:   A. Proair HFA or similar - 2 inhalations every 4-6 hours  B. Nasal saline  5.  Continue immunotherapy and EpiPen / Auvi-Q   6. Consider nicotine substitutes to replace tobacco smoke exposure  7. Return to clinic 1 year or earlier if problem

## 2019-04-27 ENCOUNTER — Encounter: Payer: Self-pay | Admitting: Allergy and Immunology

## 2019-05-04 ENCOUNTER — Ambulatory Visit (INDEPENDENT_AMBULATORY_CARE_PROVIDER_SITE_OTHER): Payer: PRIVATE HEALTH INSURANCE | Admitting: *Deleted

## 2019-05-04 DIAGNOSIS — J309 Allergic rhinitis, unspecified: Secondary | ICD-10-CM

## 2019-05-12 ENCOUNTER — Ambulatory Visit (INDEPENDENT_AMBULATORY_CARE_PROVIDER_SITE_OTHER): Payer: PRIVATE HEALTH INSURANCE | Admitting: *Deleted

## 2019-05-12 DIAGNOSIS — J309 Allergic rhinitis, unspecified: Secondary | ICD-10-CM | POA: Diagnosis not present

## 2019-05-16 ENCOUNTER — Ambulatory Visit (INDEPENDENT_AMBULATORY_CARE_PROVIDER_SITE_OTHER): Payer: PRIVATE HEALTH INSURANCE | Admitting: *Deleted

## 2019-05-16 DIAGNOSIS — J309 Allergic rhinitis, unspecified: Secondary | ICD-10-CM

## 2019-05-23 ENCOUNTER — Ambulatory Visit (INDEPENDENT_AMBULATORY_CARE_PROVIDER_SITE_OTHER): Payer: PRIVATE HEALTH INSURANCE

## 2019-05-23 DIAGNOSIS — J309 Allergic rhinitis, unspecified: Secondary | ICD-10-CM | POA: Diagnosis not present

## 2019-06-02 ENCOUNTER — Ambulatory Visit (INDEPENDENT_AMBULATORY_CARE_PROVIDER_SITE_OTHER): Payer: PRIVATE HEALTH INSURANCE | Admitting: *Deleted

## 2019-06-02 DIAGNOSIS — J309 Allergic rhinitis, unspecified: Secondary | ICD-10-CM | POA: Diagnosis not present

## 2019-06-26 ENCOUNTER — Ambulatory Visit (INDEPENDENT_AMBULATORY_CARE_PROVIDER_SITE_OTHER): Payer: PRIVATE HEALTH INSURANCE | Admitting: *Deleted

## 2019-06-26 DIAGNOSIS — J309 Allergic rhinitis, unspecified: Secondary | ICD-10-CM

## 2019-07-06 ENCOUNTER — Ambulatory Visit (INDEPENDENT_AMBULATORY_CARE_PROVIDER_SITE_OTHER): Payer: PRIVATE HEALTH INSURANCE | Admitting: *Deleted

## 2019-07-06 DIAGNOSIS — J309 Allergic rhinitis, unspecified: Secondary | ICD-10-CM

## 2019-07-18 DIAGNOSIS — J301 Allergic rhinitis due to pollen: Secondary | ICD-10-CM

## 2019-07-18 NOTE — Progress Notes (Signed)
Vials exp 07-17-20 

## 2019-07-21 ENCOUNTER — Ambulatory Visit (INDEPENDENT_AMBULATORY_CARE_PROVIDER_SITE_OTHER): Payer: PRIVATE HEALTH INSURANCE | Admitting: *Deleted

## 2019-07-21 DIAGNOSIS — J309 Allergic rhinitis, unspecified: Secondary | ICD-10-CM | POA: Diagnosis not present

## 2019-08-03 ENCOUNTER — Ambulatory Visit (INDEPENDENT_AMBULATORY_CARE_PROVIDER_SITE_OTHER): Payer: PRIVATE HEALTH INSURANCE | Admitting: *Deleted

## 2019-08-03 DIAGNOSIS — J309 Allergic rhinitis, unspecified: Secondary | ICD-10-CM | POA: Diagnosis not present

## 2019-08-22 ENCOUNTER — Ambulatory Visit (INDEPENDENT_AMBULATORY_CARE_PROVIDER_SITE_OTHER): Payer: PRIVATE HEALTH INSURANCE | Admitting: *Deleted

## 2019-08-22 DIAGNOSIS — J309 Allergic rhinitis, unspecified: Secondary | ICD-10-CM | POA: Diagnosis not present

## 2019-09-12 ENCOUNTER — Ambulatory Visit (INDEPENDENT_AMBULATORY_CARE_PROVIDER_SITE_OTHER): Payer: PRIVATE HEALTH INSURANCE | Admitting: *Deleted

## 2019-09-12 DIAGNOSIS — J309 Allergic rhinitis, unspecified: Secondary | ICD-10-CM

## 2019-09-28 ENCOUNTER — Ambulatory Visit (INDEPENDENT_AMBULATORY_CARE_PROVIDER_SITE_OTHER): Payer: PRIVATE HEALTH INSURANCE | Admitting: *Deleted

## 2019-09-28 DIAGNOSIS — J309 Allergic rhinitis, unspecified: Secondary | ICD-10-CM

## 2019-10-26 ENCOUNTER — Ambulatory Visit (INDEPENDENT_AMBULATORY_CARE_PROVIDER_SITE_OTHER): Payer: PRIVATE HEALTH INSURANCE | Admitting: *Deleted

## 2019-10-26 DIAGNOSIS — J309 Allergic rhinitis, unspecified: Secondary | ICD-10-CM | POA: Diagnosis not present

## 2019-11-22 ENCOUNTER — Ambulatory Visit (INDEPENDENT_AMBULATORY_CARE_PROVIDER_SITE_OTHER): Payer: PRIVATE HEALTH INSURANCE | Admitting: *Deleted

## 2019-11-22 DIAGNOSIS — J309 Allergic rhinitis, unspecified: Secondary | ICD-10-CM

## 2019-12-06 ENCOUNTER — Ambulatory Visit (INDEPENDENT_AMBULATORY_CARE_PROVIDER_SITE_OTHER): Payer: PRIVATE HEALTH INSURANCE

## 2019-12-06 DIAGNOSIS — J309 Allergic rhinitis, unspecified: Secondary | ICD-10-CM | POA: Diagnosis not present

## 2020-01-22 ENCOUNTER — Ambulatory Visit (INDEPENDENT_AMBULATORY_CARE_PROVIDER_SITE_OTHER): Payer: PRIVATE HEALTH INSURANCE | Admitting: *Deleted

## 2020-01-22 DIAGNOSIS — J309 Allergic rhinitis, unspecified: Secondary | ICD-10-CM

## 2020-02-15 ENCOUNTER — Ambulatory Visit (INDEPENDENT_AMBULATORY_CARE_PROVIDER_SITE_OTHER): Payer: PRIVATE HEALTH INSURANCE | Admitting: *Deleted

## 2020-02-15 DIAGNOSIS — J309 Allergic rhinitis, unspecified: Secondary | ICD-10-CM

## 2020-03-12 ENCOUNTER — Ambulatory Visit (INDEPENDENT_AMBULATORY_CARE_PROVIDER_SITE_OTHER): Payer: PRIVATE HEALTH INSURANCE

## 2020-03-12 DIAGNOSIS — J309 Allergic rhinitis, unspecified: Secondary | ICD-10-CM

## 2020-04-30 ENCOUNTER — Ambulatory Visit (INDEPENDENT_AMBULATORY_CARE_PROVIDER_SITE_OTHER): Payer: PRIVATE HEALTH INSURANCE

## 2020-04-30 DIAGNOSIS — J309 Allergic rhinitis, unspecified: Secondary | ICD-10-CM | POA: Diagnosis not present

## 2020-05-29 ENCOUNTER — Ambulatory Visit (INDEPENDENT_AMBULATORY_CARE_PROVIDER_SITE_OTHER): Payer: PRIVATE HEALTH INSURANCE | Admitting: *Deleted

## 2020-05-29 DIAGNOSIS — J309 Allergic rhinitis, unspecified: Secondary | ICD-10-CM

## 2020-06-25 ENCOUNTER — Ambulatory Visit (INDEPENDENT_AMBULATORY_CARE_PROVIDER_SITE_OTHER): Payer: PRIVATE HEALTH INSURANCE

## 2020-06-25 ENCOUNTER — Encounter: Payer: Self-pay | Admitting: Allergy

## 2020-06-25 DIAGNOSIS — J309 Allergic rhinitis, unspecified: Secondary | ICD-10-CM

## 2020-07-16 ENCOUNTER — Ambulatory Visit (INDEPENDENT_AMBULATORY_CARE_PROVIDER_SITE_OTHER): Payer: PRIVATE HEALTH INSURANCE | Admitting: *Deleted

## 2020-07-16 DIAGNOSIS — J309 Allergic rhinitis, unspecified: Secondary | ICD-10-CM

## 2020-07-25 DIAGNOSIS — J301 Allergic rhinitis due to pollen: Secondary | ICD-10-CM | POA: Diagnosis not present

## 2020-07-25 NOTE — Progress Notes (Signed)
VIALS MADE.  EXP 07-25-21 

## 2021-08-09 DIAGNOSIS — F1020 Alcohol dependence, uncomplicated: Secondary | ICD-10-CM | POA: Insufficient documentation

## 2021-08-09 DIAGNOSIS — Z720 Tobacco use: Secondary | ICD-10-CM | POA: Insufficient documentation

## 2021-08-09 DIAGNOSIS — I10 Essential (primary) hypertension: Secondary | ICD-10-CM | POA: Insufficient documentation

## 2021-08-14 ENCOUNTER — Ambulatory Visit: Payer: Self-pay | Admitting: Cardiology

## 2023-01-12 ENCOUNTER — Ambulatory Visit (HOSPITAL_BASED_OUTPATIENT_CLINIC_OR_DEPARTMENT_OTHER)
Admission: EM | Admit: 2023-01-12 | Discharge: 2023-01-12 | Disposition: A | Discharge status: Home / Self Care | Payer: Self-pay | Attending: Internal Medicine | Admitting: Internal Medicine

## 2023-01-12 ENCOUNTER — Encounter (HOSPITAL_BASED_OUTPATIENT_CLINIC_OR_DEPARTMENT_OTHER): Payer: Self-pay

## 2023-01-12 DIAGNOSIS — J180 Bronchopneumonia, unspecified organism: Secondary | ICD-10-CM

## 2023-01-12 MED ORDER — AZITHROMYCIN 250 MG PO TABS
250.0000 mg | ORAL_TABLET | Freq: Every day | ORAL | 0 refills | Status: DC
Start: 2023-01-12 — End: 2023-09-15

## 2023-01-12 MED ORDER — IPRATROPIUM-ALBUTEROL 0.5-2.5 (3) MG/3ML IN SOLN
3.0000 mL | Freq: Once | RESPIRATORY_TRACT | Status: AC
  Administered 2023-01-12: 3 mL via RESPIRATORY_TRACT

## 2023-01-12 MED ORDER — PROMETHAZINE-DM 6.25-15 MG/5ML PO SYRP: 5.0000 mL | ORAL_SOLUTION | Freq: Four times a day (QID) | ORAL | 0 refills | Status: AC | PRN

## 2023-01-12 MED ORDER — ALBUTEROL SULFATE HFA 108 (90 BASE) MCG/ACT IN AERS: 2.0000 | INHALATION_SPRAY | RESPIRATORY_TRACT | 0 refills | Status: DC | PRN

## 2023-01-12 NOTE — ED Triage Notes (Signed)
Reports onset of cough, nasal congestion last week Wednesday. Started Amoxicillin on Saturday. Reports continues to feel bad. States home covid test negative. Blood pressure elevated. Reports has been prescribed lisinopril and losartan "a long time ago". "It made me feel bad so I stopped taking it.

## 2023-01-12 NOTE — Discharge Instructions (Signed)
Follow-up with your doctor in a few days for recheck and for further evaluation of your elevated blood pressure Go to the emergency department for severe symptoms new symptoms or concerns specifically for development of chest pain, shortness of breath, high fever

## 2023-01-12 NOTE — ED Provider Notes (Signed)
[facility]    CSN: [id number] Arrival date & time: 01/12/23  1355      History   Chief Complaint Chief Complaint  Patient presents with   Cough   Sore Throat    HPI Gregory Lambert is a 32 y.o. male.    Cough Associated symptoms: headaches, rhinorrhea, shortness of breath, sore throat and wheezing   Associated symptoms: no chest pain, no chills, no ear pain and no fever   Sore Throat Associated symptoms include headaches and shortness of breath. Pertinent negatives include no chest pain.  Sick for 5 days symptoms include cough, congestion, wheezing, shortness of breath, green nasal drainage.  Had a telehealth visit 4 days ago was treated with an antibiotic, states no improvement.  Home COVID test was negative.  Also taking over-the-counter medications with some relief.  Denies close contacts with illness.  Admits recent travel to the beach for a conference.  He is a smoker has not smoked in the last 3 days secondary to symptoms.  Has been treated for hypertension in the past does not take the medication.  Past Medical History:  Diagnosis Date   Anxiety    Asthma    Sinusitis    Urticaria     There are no problems to display for this patient.   Past Surgical History:  Procedure Laterality Date   CHOLECYSTECTOMY     TONSILLECTOMY     WISDOM TOOTH EXTRACTION         Home Medications    Prior to Admission medications   Medication Sig Start Date End Date Taking? Authorizing Provider  albuterol (VENTOLIN HFA) 108 (90 Base) MCG/ACT inhaler Inhale 2 puffs into the lungs every 4 (four) hours as needed for up to 10 days for wheezing or shortness of breath. 01/12/23 01/22/23 Yes Meliton Rattan, PA  amoxicillin (AMOXIL) 875 MG tablet Take 875 mg by mouth 2 (two) times daily.   Yes [provider]  azithromycin (ZITHROMAX) 250 MG tablet Take 1 tablet (250 mg total) by mouth daily. Take first 2 tablets together, then 1 every day until finished. 01/12/23   Yes Meliton Rattan, PA  promethazine-dextromethorphan (PROMETHAZINE-DM) 6.25-15 MG/5ML syrup Take 5 mLs by mouth 4 (four) times daily as needed for up to 6 days for cough. 01/12/23 01/18/23 Yes Meliton Rattan, PA  EPINEPHrine (EPIPEN 2-PAK) 0.3 mg/0.3 mL IJ SOAJ injection Inject 0.3 mg into the muscle as needed for anaphylaxis.    [provider]  fexofenadine (ALLEGRA) 180 MG tablet Take 180 mg by mouth daily.    [provider]  fluticasone (FLONASE) 50 MCG/ACT nasal spray Place 1 spray into both nostrils daily as needed for allergies.    [provider]    Family History Family History  Problem Relation Age of Onset   Asthma Maternal Grandmother    Allergic rhinitis Neg Hx    Angioedema Neg Hx    Eczema Neg Hx    Immunodeficiency Neg Hx    Urticaria Neg Hx     Social History Social History   Tobacco Use   Smoking status: Some Days    Current packs/day: 0.25    Average packs/day: 0.3 packs/day for 17.0 years (4.3 ttl pk-yrs)    Types: Cigarettes   Smokeless tobacco: Current    Types: Chew  Vaping Use   Vaping status: Never Used     Allergies   Montelukast and Prednisone   Review of Systems Review of Systems  Constitutional:  Positive for  fatigue. Negative for appetite change, chills and fever.  HENT:  Positive for congestion, rhinorrhea and sore throat. Negative for ear pain, trouble swallowing and voice change.   Respiratory:  Positive for cough, shortness of breath and wheezing. Negative for chest tightness.   Cardiovascular:  Negative for chest pain.  Gastrointestinal:  Negative for diarrhea, nausea and vomiting.  Neurological:  Positive for headaches.     Physical Exam Triage Vital Signs ED Triage Vitals  Encounter Vitals Group     BP 01/12/23 1405 (!) 162/112     Systolic BP Percentile --      Diastolic BP Percentile --      Pulse Rate 01/12/23 1405 80     Resp 01/12/23 1405 18     Temp 01/12/23 1405 (!) 97.1 F (36.2 C)      Temp Source 01/12/23 1405 Oral     SpO2 01/12/23 1405 98 %     Weight --      Height --      Head Circumference --      Peak Flow --      Pain Score 01/12/23 1408 5     Pain Loc --      Pain Education --      Exclude from Growth Chart --    No data found.  Updated Vital Signs BP (!) 162/112 (BP Location: Right Arm) Comment: Left arm 155/103  Pulse 80   Temp (!) 97.1 F (36.2 C) (Oral)   Resp 18   SpO2 98%   Visual Acuity Right Eye Distance:   Left Eye Distance:   Bilateral Distance:    Right Eye Near:   Left Eye Near:    Bilateral Near:     Physical Exam Vitals and nursing note reviewed.  Constitutional:      Appearance: He is not ill-appearing or toxic-appearing.  HENT:     Head: Normocephalic and atraumatic.     Right Ear: Tympanic membrane normal.     Left Ear: Tympanic membrane normal.     Nose: Congestion present. No rhinorrhea.     Mouth/Throat:     Mouth: Mucous membranes are moist.     Pharynx: Uvula midline. No pharyngeal swelling, oropharyngeal exudate, posterior oropharyngeal erythema or uvula swelling.     Tonsils: No tonsillar exudate or tonsillar abscesses.  Eyes:     Conjunctiva/sclera: Conjunctivae normal.  Cardiovascular:     Rate and Rhythm: Normal rate and regular rhythm.  Pulmonary:     Effort: No tachypnea, respiratory distress or retractions.     Breath sounds: Wheezing and rhonchi present. No rales.  Musculoskeletal:     Cervical back: Neck supple.  Lymphadenopathy:     Cervical: No cervical adenopathy.  Neurological:     Mental Status: He is alert.      UC Treatments / Results  Labs (all labs ordered are listed, but only abnormal results are displayed) Labs Reviewed - No data to display  EKG   Radiology No results found.  Procedures Procedures (including critical care time)  Medications Ordered in UC Medications  ipratropium-albuterol (DUONEB) 0.5-2.5 (3) MG/3ML nebulizer solution 3 mL (3 mLs Nebulization Given  01/12/23 1430)    Initial Impression / Assessment and Plan / UC Course  I have reviewed the triage vital signs and the nursing notes.  Pertinent labs & imaging results that were available during my care of the patient were reviewed by me and considered in my medical decision making (see chart for details).  32 year old smoker presents with not feeling well for 5 days, he is hypertensive at 162/112 has a history of elevated blood pressures stopped his medications.  Nasal congestion, diffuse wheezing and rhonchi on exam.  He was given a DuoNeb with some improvement.  He denies feeling any better.  Patient denies history of asthma however chart review shows he has been treated with inhaler in the past.  He is currently taking amoxicillin will add second antibiotic to cover atypical infections however likely viral illness, will prescribe inhaler, he was counseled not to smoke, commend he follow-up with his PCP ASAP for recheck.  ED precautions reviewed  Final diagnoses:  Bronchopneumonia     Discharge Instructions      Follow-up with your doctor in a few days for recheck and for further evaluation of your elevated blood pressure Go to the emergency department for severe symptoms new symptoms or concerns specifically for development of chest pain, shortness of breath, high fever     ED Prescriptions     Medication Sig Dispense Auth. Provider   azithromycin (ZITHROMAX) 250 MG tablet Take 1 tablet (250 mg total) by mouth daily. Take first 2 tablets together, then 1 every day until finished. 6 tablet Meliton Rattan, PA   albuterol (VENTOLIN HFA) 108 (90 Base) MCG/ACT inhaler Inhale 2 puffs into the lungs every 4 (four) hours as needed for up to 10 days for wheezing or shortness of breath. 1 each Meliton Rattan, PA   promethazine-dextromethorphan (PROMETHAZINE-DM) 6.25-15 MG/5ML syrup Take 5 mLs by mouth 4 (four) times daily as needed for up to 6 days for cough. 118 mL Meliton Rattan, [place]       PDMP not reviewed this encounter.   Meliton Rattan, [place] 01/12/23 1453

## 2023-03-01 ENCOUNTER — Encounter (HOSPITAL_COMMUNITY): Payer: Self-pay | Admitting: Emergency Medicine

## 2023-03-01 ENCOUNTER — Emergency Department (HOSPITAL_COMMUNITY): Payer: BC Managed Care – PPO

## 2023-03-01 ENCOUNTER — Emergency Department (HOSPITAL_COMMUNITY)
Admission: EM | Admit: 2023-03-01 | Discharge: 2023-03-02 | Discharge status: Against Medical Advice | Payer: BC Managed Care – PPO | Attending: Emergency Medicine | Admitting: Emergency Medicine

## 2023-03-01 DIAGNOSIS — Z5321 Procedure and treatment not carried out due to patient leaving prior to being seen by health care provider: Secondary | ICD-10-CM | POA: Diagnosis not present

## 2023-03-01 DIAGNOSIS — F17210 Nicotine dependence, cigarettes, uncomplicated: Secondary | ICD-10-CM | POA: Diagnosis not present

## 2023-03-01 DIAGNOSIS — R079 Chest pain, unspecified: Secondary | ICD-10-CM | POA: Diagnosis not present

## 2023-03-01 DIAGNOSIS — R0789 Other chest pain: Secondary | ICD-10-CM | POA: Diagnosis not present

## 2023-03-01 DIAGNOSIS — R030 Elevated blood-pressure reading, without diagnosis of hypertension: Secondary | ICD-10-CM | POA: Diagnosis not present

## 2023-03-01 LAB — CBC
HCT: 53.2 % — ABNORMAL HIGH (ref 39.0–52.0)
Hemoglobin: 18.3 g/dL — ABNORMAL HIGH (ref 13.0–17.0)
MCH: 32.3 pg (ref 26.0–34.0)
MCHC: 34.4 g/dL (ref 30.0–36.0)
MCV: 94 fL (ref 80.0–100.0)
Platelets: 274 10*3/uL (ref 150–400)
RBC: 5.66 MIL/uL (ref 4.22–5.81)
RDW: 12.7 % (ref 11.5–15.5)
WBC: 8.2 10*3/uL (ref 4.0–10.5)
nRBC: 0 % (ref 0.0–0.2)

## 2023-03-01 LAB — BASIC METABOLIC PANEL
Anion gap: 17 — ABNORMAL HIGH (ref 5–15)
BUN: 6 mg/dL (ref 6–20)
CO2: 20 mmol/L — ABNORMAL LOW (ref 22–32)
Calcium: 9.4 mg/dL (ref 8.9–10.3)
Chloride: 104 mmol/L (ref 98–111)
Creatinine, Ser: 0.76 mg/dL (ref 0.61–1.24)
GFR, Estimated: >60 mL/min (ref >60)
Glucose, Bld: 117 mg/dL — ABNORMAL HIGH (ref 70–99)
Potassium: 3.8 mmol/L (ref 3.5–5.1)
Sodium: 141 mmol/L (ref 135–145)

## 2023-03-01 LAB — TROPONIN I (HIGH SENSITIVITY): Troponin I (High Sensitivity): 3 ng/L (ref <18)

## 2023-03-01 NOTE — ED Triage Notes (Addendum)
 Chest pain since Thursday. SOB as well. Reports he feels like someone sitting on chest. Sometime sharp. Takes ibuprofen and sometimes seems to help. He is here today because SOB is worsening and feels like gasping. Smokes cigarettes 0.5 pack/ day and still smoking. Denies fevers. Denies anyone around him being sick.   Did start taking losarten Monday before this started. Has been on it before but just restarted.

## 2023-03-01 NOTE — ED Provider Triage Note (Signed)
 Emergency Medicine Provider Triage Evaluation Note  Gregory Lambert , a 33 y.o. male  was evaluated in triage.  Pt complains of chest pain.  States same began either Thursday or Friday and has been persistent since then.  Pain feels like pressure like someone is sitting on his chest.  Pain is left-sided, radiates into his left arm and into his back as well.  Denies history of similar pain previously.  No recent travel or surgeries.  No leg pain or leg swelling.  Patient does have high blood pressure, states that he routinely misses doses of his medication.  He does smoke cigarettes daily.  Denies any cardiac history.  No aggravating or relieving factors.  Review of Systems  Positive:  Negative:   Physical Exam  BP 133/79 (BP Location: Right Arm)   Pulse (!) 103   Temp 98.3 F (36.8 C)   Resp 18   SpO2 98%  Gen:   Awake, no distress   Resp:  Normal effort  MSK:   Moves extremities without difficulty  Other:    Medical Decision Making  Medically screening exam initiated at 9:00 PM.  Appropriate orders placed.  Gregory Lambert was informed that the remainder of the evaluation will be completed by another provider, this initial triage assessment does not replace that evaluation, and the importance of remaining in the ED until their evaluation is complete.  Work-up initiated    Yadir Zentner A, PA-C 03/01/23 2101

## 2023-03-02 NOTE — ED Notes (Signed)
 Called several times no answer for trop

## 2023-03-02 NOTE — ED Notes (Signed)
 Pt called to reassess vitals and sign MSE, no response

## 2023-03-03 DIAGNOSIS — R0789 Other chest pain: Secondary | ICD-10-CM | POA: Diagnosis not present

## 2023-03-03 DIAGNOSIS — R000 Tachycardia, unspecified: Secondary | ICD-10-CM | POA: Diagnosis not present

## 2023-03-03 DIAGNOSIS — R079 Chest pain, unspecified: Secondary | ICD-10-CM | POA: Diagnosis not present

## 2023-03-03 DIAGNOSIS — F17210 Nicotine dependence, cigarettes, uncomplicated: Secondary | ICD-10-CM | POA: Diagnosis not present

## 2023-03-03 DIAGNOSIS — Z79899 Other long term (current) drug therapy: Secondary | ICD-10-CM | POA: Diagnosis not present

## 2023-03-03 DIAGNOSIS — I10 Essential (primary) hypertension: Secondary | ICD-10-CM | POA: Diagnosis not present

## 2023-05-07 DIAGNOSIS — R9431 Abnormal electrocardiogram [ECG] [EKG]: Secondary | ICD-10-CM | POA: Diagnosis not present

## 2023-05-07 DIAGNOSIS — R519 Headache, unspecified: Secondary | ICD-10-CM | POA: Diagnosis not present

## 2023-05-07 DIAGNOSIS — I498 Other specified cardiac arrhythmias: Secondary | ICD-10-CM | POA: Diagnosis not present

## 2023-05-07 DIAGNOSIS — Z79899 Other long term (current) drug therapy: Secondary | ICD-10-CM | POA: Diagnosis not present

## 2023-05-07 DIAGNOSIS — R42 Dizziness and giddiness: Secondary | ICD-10-CM | POA: Diagnosis not present

## 2023-05-27 ENCOUNTER — Ambulatory Visit (INDEPENDENT_AMBULATORY_CARE_PROVIDER_SITE_OTHER): Admitting: Student

## 2023-05-27 ENCOUNTER — Encounter (HOSPITAL_BASED_OUTPATIENT_CLINIC_OR_DEPARTMENT_OTHER): Payer: Self-pay | Admitting: Student

## 2023-05-27 VITALS — BP 152/104 | HR 87 | Temp 98.6°F | Resp 16 | Ht 72.0 in | Wt 218.1 lb

## 2023-05-27 DIAGNOSIS — J302 Other seasonal allergic rhinitis: Secondary | ICD-10-CM | POA: Diagnosis not present

## 2023-05-27 DIAGNOSIS — Z131 Encounter for screening for diabetes mellitus: Secondary | ICD-10-CM | POA: Diagnosis not present

## 2023-05-27 DIAGNOSIS — J4520 Mild intermittent asthma, uncomplicated: Secondary | ICD-10-CM | POA: Diagnosis not present

## 2023-05-27 DIAGNOSIS — I10 Essential (primary) hypertension: Secondary | ICD-10-CM | POA: Diagnosis not present

## 2023-05-27 DIAGNOSIS — Z136 Encounter for screening for cardiovascular disorders: Secondary | ICD-10-CM | POA: Diagnosis not present

## 2023-05-27 DIAGNOSIS — F5101 Primary insomnia: Secondary | ICD-10-CM

## 2023-05-27 DIAGNOSIS — Z13220 Encounter for screening for lipoid disorders: Secondary | ICD-10-CM

## 2023-05-27 DIAGNOSIS — Z7689 Persons encountering health services in other specified circumstances: Secondary | ICD-10-CM

## 2023-05-27 MED ORDER — HYDROCHLOROTHIAZIDE 12.5 MG PO TABS
12.5000 mg | ORAL_TABLET | Freq: Every day | ORAL | 4 refills | Status: DC
Start: 2023-05-27 — End: 2023-06-07

## 2023-05-27 MED ORDER — ALBUTEROL SULFATE HFA 108 (90 BASE) MCG/ACT IN AERS: 2.0000 | INHALATION_SPRAY | RESPIRATORY_TRACT | 4 refills | Status: AC | PRN

## 2023-05-27 MED ORDER — TRAZODONE HCL 50 MG PO TABS
50.0000 mg | ORAL_TABLET | Freq: Every day | ORAL | 3 refills | Status: DC
Start: 2023-05-27 — End: 2023-10-29

## 2023-05-27 NOTE — Patient Instructions (Addendum)
 It was nice to see you today!  As we discussed in clinic:   Trazodone: For insomnia, this medication should be taken 50 to 100 mg orally 1 hour prior to bedtime. Side Effects: While this drug does tend to be well tolerated, please let me know if you have any side effects on this med. This drug may cause headaches, nausea, constipation, blurred vision, orthostatic hypotension (low BP), syncope (passing out), balance disorders, or gait disturbance.   Message me in 2 weeks with how your blood pressure is going.  Magnesium glycinate 400mg  30 minutes before bed time.  A type of therapy known as CBT-I is considered the first-line treatment for insomnia. It may even be able to help 4/5 individuals get better sleep. At this time I am unaware of many institutions offering CBT-I within our area.  There are some online therapy use which can be both effective for sleep and cost effective as well.  Go! To Sleep- Web-based App-this is a Energy manager therapy platform created by [facility] clinic for patients with insomnia.  Last time checked, the cost was around $40 (pretty cheap for better sleep!).  CBT-I Coach- iOS/Android App- This app provides education about sleep and CBT-I, personalized feedback about your sleep, and an option for sleep diary to track wake and sleep times.  Sleepio-  iOS/Android App and Web-based App- A fairly popular option, may be worth a try!  [facility]omnia- (text based)- this option doesn't provide with audio or with an app but Carmelia Roller has some helpful information for you!  If you have any problems before your next visit feel free to message me via MyChart (minor issues or questions) or call the office, otherwise you may reach out to schedule an office visit.  Thank you! Gerilyn Pilgrim Ronnisha Felber, PA-C

## 2023-05-27 NOTE — Assessment & Plan Note (Signed)
 Chronic insomnia with difficulty maintaining sleep, waking up every hour. Previous use of Tylenol PM and melatonin was ineffective. Anxiety and stress may be contributing factors. He reports better sleep with alcohol, which he has recently stopped. Trazodone is recommended for sleep and potential anxiety benefit. CBT-I is suggested as a first-line treatment for insomnia, with a reported success rate of improving sleep in four out of five people. - Prescribe trazodone 50 mg at bedtime. Advise monitoring effectiveness and report in 3 weeks. - Provide resources for CBT-I (Cognitive Behavioral Therapy for Insomnia) and encourage exploration. - Recommend magnesium glycinate 30 minutes before bedtime to aid sleep.

## 2023-05-27 NOTE — Assessment & Plan Note (Signed)
 Asthma with rare exacerbations. He reports occasional need for inhaler use, primarily during severe episodes. No recent exacerbations reported. - Prescribe albuterol inhaler for as-needed use.

## 2023-05-27 NOTE — Assessment & Plan Note (Signed)
 Discussion of allergy management and potential need for EpiPen. He reports previous allergy testing and rare use of inhaler for asthma. No current allergy symptoms reported. - Recommend Claritin or Allegra for mild allergy symptoms as needed. - Recommend Flonase for nasal symptoms if he occurs.

## 2023-05-27 NOTE — Assessment & Plan Note (Signed)
 Chronic hypertension with recent increase in blood pressure readings. He reports cessation of alcohol and smoking a week and a half ago, but blood pressure remains elevated. Previous trials of lisinopril and losartan were not tolerated due to adverse effects. Current symptoms include tinnitus and palpitations, likely related to elevated blood pressure. Family history of hypertension. Stress and lack of sleep may be contributing factors. Hydrochlorothiazide is recommended as a gentle diuretic to reduce blood pressure and decrease kidney stone formation by reducing calcium in the kidneys. Clonidine was discussed for rapid blood pressure reduction but was declined due to potential side effects and the need to drive. - Prescribe hydrochlorothiazide 25 mg once daily. Advise home blood pressure monitoring and report in 1-2 weeks. - Discuss potential side effects of hydrochlorothiazide, including increased urination and dehydration. Advise maintaining adequate hydration. - Discuss potential benefits of hydrochlorothiazide in reducing kidney stone formation. - Order blood work to assess current health status. - Follow up in 2 weeks to assess blood pressure control.

## 2023-05-27 NOTE — Progress Notes (Signed)
 New Patient Office Visit  Subjective    Patient ID: Gregory Lambert, male    DOB: May 12, 1990  Age: 33 y.o. MRN: [id number]  CC:  Chief Complaint  Patient presents with   Establish Care    Here to establish care. Ears are always ringing. Can't sleep. Unsure when it started or if it is related to BP. Beer helps to sleep. BP ranging 125-150's/80's/100's.   Medication Refill    Need refill on epipen and albuterol inhaler.     Discussed the use of AI scribe software for clinical note transcription with the patient, who gave verbal consent to proceed.  History of Present Illness   Gregory Lambert is a 33 year old male with hypertension who presents with elevated blood pressure and associated symptoms.  He has experienced elevated blood pressure for a while, with a noted increase in the past few weeks. His blood pressure was previously around 140/90 mmHg but has since increased. He stopped drinking and smoking about a week and a half ago in an attempt to lower his blood pressure, but has not seen improvement.  He has tried lisinopril and losartan in the past. Lisinopril caused him to wake up coughing with swollen lips, leading to its discontinuation. Losartan made him feel off and caused generalized pain after a week of use, so he could only tolerate half the dose.  He experiences ringing in his ears, particularly on the left side, which started a few weeks ago and coincides with the increase in blood pressure. He also reports palpitations, waking up every hour at night, and feeling sweaty. He has tried Tylenol PM for sleep without success and recalls melatonin making him groggy the next day.  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 He does not sleep well, waking up frequently with a racing heart. He has experienced dizziness, described as lightheadedness, which he attributes to possible dehydration despite drinking a lot of water.  He has a  family history of high blood pressure. He has a history of a kidney stone that has not passed but denies any recent kidney stones. He has a history of allergies and occasionally uses an inhaler for asthma, though he rarely needs it. He has not taken allergy medications in a year or two but used to take Allegra.     Screenings:  Colon Cancer: Not due Lung Cancer: not due- states that he stopped last week. Diabetes: indicated HLD: Indicated   Outpatient Encounter Medications as of 05/27/2023  Medication Sig   hydrochlorothiazide (HYDRODIURIL) 12.5 MG tablet Take 1 tablet (12.5 mg total) by mouth daily.   ibuprofen (ADVIL) 200 MG tablet Take 200 mg by mouth every 6 (six) hours as needed for fever or mild pain (pain score 1-3).   traZODone (DESYREL) 50 MG tablet Take 1 tablet (50 mg total) by mouth at bedtime.   [DISCONTINUED] albuterol (VENTOLIN HFA) 108 (90 Base) MCG/ACT inhaler Inhale 2 puffs into the lungs every 4 (four) hours as needed for up to 10 days for wheezing or shortness of breath.   albuterol (VENTOLIN HFA) 108 (90 Base) MCG/ACT inhaler Inhale 2 puffs into the lungs every 4 (four) hours as needed for up to 10 days for wheezing or shortness of breath.   amoxicillin (AMOXIL) 875 MG tablet Take 875 mg by mouth 2 (two) times daily. (Patient not taking: Reported on 05/27/2023)   azithromycin (ZITHROMAX) 250 MG tablet Take 1 tablet (250 mg total)  by mouth daily. Take first 2 tablets together, then 1 every day until finished. (Patient not taking: Reported on 05/27/2023)   EPINEPHrine (EPIPEN 2-PAK) 0.3 mg/0.3 mL IJ SOAJ injection Inject 0.3 mg into the muscle as needed for anaphylaxis. (Patient not taking: Reported on 05/27/2023)   losartan (COZAAR) 25 MG tablet Take 12.5 mg by mouth daily. (Patient not taking: Reported on 05/27/2023)   No facility-administered encounter medications on file as of 05/27/2023.    Past Medical History:  Diagnosis Date   Anxiety    Asthma    Sinusitis     Urticaria     Past Surgical History:  Procedure Laterality Date   CHOLECYSTECTOMY     TONSILLECTOMY     WISDOM TOOTH EXTRACTION      Family History  Problem Relation Age of Onset   Hypertension Mother    Asthma Maternal Grandmother    Allergic rhinitis Neg Hx    Angioedema Neg Hx    Eczema Neg Hx    Immunodeficiency Neg Hx    Urticaria Neg Hx     Social History   Socioeconomic History   Marital status: Married    Spouse name: Not on file   Number of children: 1   Years of education: Not on file   Highest education level: Not on file  Occupational History   Not on file  Tobacco Use   Smoking status: Former    Current packs/day: 0.00    Average packs/day: 0.3 packs/day for 16.0 years (4.0 ttl pk-yrs)    Types: Cigarettes    Start date: 2009    Quit date: 2025    Years since quitting: 0.2    Passive exposure: Past   Smokeless tobacco: Current    Types: Chew  Vaping Use   Vaping status: Never Used  Substance and Sexual Activity   Alcohol use: Not Currently    Comment: QUIT 1 WEEK AGO 05/27/2023   Drug use: Not Currently   Sexual activity: Not on file  Other Topics Concern   Not on file  Social History Narrative   Not on file   Social Drivers of Health   Financial Resource Strain: Low Risk  (08/09/2021)   Received from [facility] [employer] Strain (CARDIA)    Difficulty of Paying Living Expenses: Not hard at all  Food Insecurity: No Food Insecurity (05/27/2023)   Hunger Vital Sign    Worried About Running Out of Food in the Last Year: Never true    Ran Out of Food in the Last Year: Never true  Transportation Needs: No Transportation Needs (05/27/2023)   PRAPARE - Administrator, Civil Service (Medical): No    Lack of Transportation (Non-Medical): No  Physical Activity: Not on file  Stress: Not on file  Social Connections: Unknown (06/30/2021)   Received from [facility]   Social Network     Social Network: Not on file  Intimate Partner Violence: Not At Risk (05/27/2023)   Humiliation, Afraid, Rape, and Kick questionnaire    Fear of Current or Ex-Partner: No    Emotionally Abused: No    Physically Abused: No    Sexually Abused: No    ROS  Per HPI      Objective    BP (!) 152/104   Pulse 87   Temp 98.6 F (37 C) (Oral)   Resp 16   Ht 6' (1.829 m)   Wt 218 lb 1.6 oz (  98.9 kg)   SpO2 99%   BMI 29.58 kg/m   Physical Exam Constitutional:      General: He is not in acute distress.    Appearance: Normal appearance. He is not ill-appearing.  HENT:     Head: Normocephalic and atraumatic.     Right Ear: External ear normal.     Left Ear: External ear normal.     Nose: Nose normal.     Mouth/Throat:     Mouth: Mucous membranes are moist.     Pharynx: Oropharynx is clear.  Eyes:     General: No scleral icterus.    Extraocular Movements: Extraocular movements intact.     Conjunctiva/sclera: Conjunctivae normal.     Pupils: Pupils are equal, round, and reactive to light.  Neck:     Vascular: No carotid bruit.  Cardiovascular:     Rate and Rhythm: Normal rate and regular rhythm.     Pulses: Normal pulses.     Heart sounds: Normal heart sounds. No murmur heard.    No friction rub.  Pulmonary:     Effort: Pulmonary effort is normal. No respiratory distress.     Breath sounds: Normal breath sounds. No wheezing, rhonchi or rales.  Musculoskeletal:        General: Normal range of motion.     Cervical back: Neck supple.     Right lower leg: No edema.     Left lower leg: No edema.  Skin:    General: Skin is warm and dry.     Coloration: Skin is not jaundiced or pale.  Neurological:     General: No focal deficit present.     Mental Status: He is alert.  Psychiatric:        Mood and Affect: Mood normal.        Behavior: Behavior normal.         Assessment & Plan:   Encounter to establish care  Primary insomnia Assessment & Plan: Chronic insomnia  with difficulty maintaining sleep, waking up every hour. Previous use of Tylenol PM and melatonin was ineffective. Anxiety and stress may be contributing factors. He reports better sleep with alcohol, which he has recently stopped. Trazodone is recommended for sleep and potential anxiety benefit. CBT-I is suggested as a first-line treatment for insomnia, with a reported success rate of improving sleep in four out of five people. - Prescribe trazodone 50 mg at bedtime. Advise monitoring effectiveness and report in 3 weeks. - Provide resources for CBT-I (Cognitive Behavioral Therapy for Insomnia) and encourage exploration. - Recommend magnesium glycinate 30 minutes before bedtime to aid sleep.  Orders: -     traZODone HCl; Take 1 tablet (50 mg total) by mouth at bedtime.  Dispense: 30 tablet; Refill: 3  Primary hypertension Assessment & Plan: Chronic hypertension with recent increase in blood pressure readings. He reports cessation of alcohol and smoking a week and a half ago, but blood pressure remains elevated. Previous trials of lisinopril and losartan were not tolerated due to adverse effects. Current symptoms include tinnitus and palpitations, likely related to elevated blood pressure. Family history of hypertension. Stress and lack of sleep may be contributing factors. Hydrochlorothiazide is recommended as a gentle diuretic to reduce blood pressure and decrease kidney stone formation by reducing calcium in the kidneys. Clonidine was discussed for rapid blood pressure reduction but was declined due to potential side effects and the need to drive. - Prescribe hydrochlorothiazide 25 mg once daily. Advise home blood pressure monitoring and  report in 1-2 weeks. - Discuss potential side effects of hydrochlorothiazide, including increased urination and dehydration. Advise maintaining adequate hydration. - Discuss potential benefits of hydrochlorothiazide in reducing kidney stone formation. - Order blood  work to assess current health status. - Follow up in 2 weeks to assess blood pressure control.  Orders: -     hydroCHLOROthiazide; Take 1 tablet (12.5 mg total) by mouth daily.  Dispense: 30 tablet; Refill: 4 -     CBC with Differential/Platelet -     Comprehensive metabolic panel with GFR  Mild intermittent asthma without complication Assessment & Plan: Asthma with rare exacerbations. He reports occasional need for inhaler use, primarily during severe episodes. No recent exacerbations reported. - Prescribe albuterol inhaler for as-needed use.  Orders: -     Albuterol Sulfate HFA; Inhale 2 puffs into the lungs every 4 (four) hours as needed for up to 10 days for wheezing or shortness of breath.  Dispense: 1 each; Refill: 4  Encounter for lipid screening for cardiovascular disease -     Lipid panel  Screening for diabetes mellitus -     Hemoglobin A1c  Seasonal allergies Assessment & Plan: Discussion of allergy management and potential need for EpiPen. He reports previous allergy testing and rare use of inhaler for asthma. No current allergy symptoms reported. - Recommend Claritin or Allegra for mild allergy symptoms as needed. - Recommend Flonase for nasal symptoms if he occurs.       Return in about 3 months (around 08/26/2023) for HTN.   (Billed as established because he has been to the [facility])  Gerilyn Pilgrim T Burrell Hodapp, PA-C

## 2023-05-28 ENCOUNTER — Encounter (HOSPITAL_BASED_OUTPATIENT_CLINIC_OR_DEPARTMENT_OTHER): Payer: Self-pay | Admitting: Student

## 2023-05-28 LAB — CBC WITH DIFFERENTIAL/PLATELET
Basophils Absolute: 0.1 10*3/uL (ref 0.0–0.2)
Basos: 1 %
EOS (ABSOLUTE): 0.2 10*3/uL (ref 0.0–0.4)
Eos: 2 %
Hematocrit: 45.3 % (ref 37.5–51.0)
Hemoglobin: 15.9 g/dL (ref 13.0–17.7)
Immature Grans (Abs): 0 10*3/uL (ref 0.0–0.1)
Immature Granulocytes: 0 %
Lymphocytes Absolute: 1.9 10*3/uL (ref 0.7–3.1)
Lymphs: 26 %
MCH: 32.9 pg (ref 26.6–33.0)
MCHC: 35.1 g/dL (ref 31.5–35.7)
MCV: 94 fL (ref 79–97)
Monocytes Absolute: 0.7 10*3/uL (ref 0.1–0.9)
Monocytes: 10 %
Neutrophils Absolute: 4.3 10*3/uL (ref 1.4–7.0)
Neutrophils: 61 %
Platelets: 222 10*3/uL (ref 150–450)
RBC: 4.83 x10E6/uL (ref 4.14–5.80)
RDW: 11.8 % (ref 11.6–15.4)
WBC: 7.1 10*3/uL (ref 3.4–10.8)

## 2023-05-28 LAB — COMPREHENSIVE METABOLIC PANEL WITH GFR
ALT: 190 IU/L — ABNORMAL HIGH (ref 0–44)
AST: 148 IU/L — ABNORMAL HIGH (ref 0–40)
Albumin: 5.1 g/dL (ref 4.1–5.1)
Alkaline Phosphatase: 77 IU/L (ref 44–121)
BUN/Creatinine Ratio: 13 (ref 9–20)
BUN: 15 mg/dL (ref 6–20)
Bilirubin Total: 1 mg/dL (ref 0.0–1.2)
CO2: 24 mmol/L (ref 20–29)
Calcium: 10.2 mg/dL (ref 8.7–10.2)
Chloride: 95 mmol/L — ABNORMAL LOW (ref 96–106)
Creatinine, Ser: 1.13 mg/dL (ref 0.76–1.27)
Globulin, Total: 2.7 g/dL (ref 1.5–4.5)
Glucose: 90 mg/dL (ref 70–99)
Potassium: 4.7 mmol/L (ref 3.5–5.2)
Sodium: 137 mmol/L (ref 134–144)
Total Protein: 7.8 g/dL (ref 6.0–8.5)
eGFR: 89 mL/min/{1.73_m2} (ref >59)

## 2023-05-28 LAB — LIPID PANEL
Chol/HDL Ratio: 4.7 ratio (ref 0.0–5.0)
Cholesterol, Total: 237 mg/dL — ABNORMAL HIGH (ref 100–199)
HDL: 50 mg/dL (ref >39)
LDL Chol Calc (NIH): 142 mg/dL — ABNORMAL HIGH (ref 0–99)
Triglycerides: 252 mg/dL — ABNORMAL HIGH (ref 0–149)
VLDL Cholesterol Cal: 45 mg/dL — ABNORMAL HIGH (ref 5–40)

## 2023-05-28 LAB — HEMOGLOBIN A1C
Est. average glucose Bld gHb Est-mCnc: 120 mg/dL
Hgb A1c MFr Bld: 5.8 % — ABNORMAL HIGH (ref 4.8–5.6)

## 2023-06-07 ENCOUNTER — Ambulatory Visit: Payer: Self-pay

## 2023-06-07 ENCOUNTER — Other Ambulatory Visit (HOSPITAL_BASED_OUTPATIENT_CLINIC_OR_DEPARTMENT_OTHER): Payer: Self-pay | Admitting: Student

## 2023-06-07 ENCOUNTER — Encounter (HOSPITAL_BASED_OUTPATIENT_CLINIC_OR_DEPARTMENT_OTHER): Payer: Self-pay | Admitting: Student

## 2023-06-07 DIAGNOSIS — I10 Essential (primary) hypertension: Secondary | ICD-10-CM

## 2023-06-07 MED ORDER — CHLORTHALIDONE 25 MG PO TABS
25.0000 mg | ORAL_TABLET | Freq: Every day | ORAL | 5 refills | Status: DC
Start: 2023-06-07 — End: 2023-06-18

## 2023-06-07 NOTE — Telephone Encounter (Signed)
 Chief Complaint: hypertension Symptoms: hypertension, ringing in the ears, headache, heart racing Frequency: seen for HTN 4/10, been taking hydrochlorothiazide  since Pertinent Negatives: Patient denies CP, SOB, one-sided weakness, visual changes, N/V Disposition: [x] ED /[] Urgent Care (no appt availability in office) / [] Appointment(In office/virtual)/ []   Virtual Care/ [] Home Care/ [x] Refused Recommended Disposition /[]  Mobile Bus/ []  Follow-up with PCP Additional Notes: Pt reports HTN. Pt's BP is 173/102. Pt seen in the office 4/10 and prescribed hydrochlorothiazide  12.5mg  daily. Pt states she has been taking 12.5mg  twice daily to try to keep his BP under control. Pt states medication is not working well. Pt states it will bring his BP down for "a little bit" after taking it, but then his BP increases again. Pt endorses a 3/10 headache with ringing in the ears (states this is not new) and states his "heart is racing." RN advised pt he needs to go to the ED given his symptomatic HTN. Pt declined and would like follow-up from the office about possibly getting his medications adjusted. RN educated pt about why the ED is the best place for his symptoms and he still declined. Pt would appreciate follow-up from the office as soon as possible about adjusting his medications. RN advised pt RN would relay his concerns. RN advised pt if he gets worse to call 911. Pt verbalized understanding. RN informed [facility] via telephone.   Copied from CRM [id number]. Topic: Clinical - Red Word Triage >> Jun 07, 2023  4:17 PM [place] J wrote: [facility] that prompted transfer to Nurse Triage: 173/102 / High blood pressure that's causing a headache. Patient isn't able to get it down and medication is not working. Reason for Disposition  [1] Systolic BP  >= 160 OR Diastolic >= 100 AND [2] cardiac (e.g., breathing difficulty, chest pain) or neurologic symptoms (e.g., new-onset blurred or double vision, unsteady  gait)  Answer Assessment - Initial Assessment Questions 1. BLOOD PRESSURE: "What is the blood pressure?" "Did you take at least two measurements 5 minutes apart?"     173/102  2. ONSET: "When did you take your blood pressure?"     10 minutes ago 3. HOW: "How did you take your blood pressure?" (e.g., automatic home BP monitor, visiting nurse)     Automatic cuff 4. HISTORY: "Do you have a history of high blood pressure?"     yes 5. MEDICINES: "Are you taking any medicines for blood pressure?" "Have you missed any doses recently?"     12.5 mg hydrochlorothiazide  daily, but patient is taking it twice a day because his BP has been high. Pt took 12.5 mg 1 hr ago and the first dose at 0630 AM 6. OTHER SYMPTOMS: "Do you have any symptoms?" (e.g., blurred vision, chest pain, difficulty breathing, headache, weakness)     Headache (3-4/10), "heart racing" (pt did not pay attn to his HR when he took his BP. "Ringing in the ears." Denies blurry vision (been ongoing since he saw his PCP). Denies CP. Denies SOB. Denies dizziness or weakness. Denies one-sided weakness to face or body.  Pt states hydrochlorothiazide  works "for a little bit" after he takes a pill (125/85), and then a couple hours later his BP comes back up. States he is not urinating more than frequently.  Protocols used: Blood Pressure - High-A-AH

## 2023-06-07 NOTE — Progress Notes (Signed)
 Switching hydrochlorothiazide  to chlorthalidone  in favor of longer half life.

## 2023-06-16 ENCOUNTER — Encounter (HOSPITAL_BASED_OUTPATIENT_CLINIC_OR_DEPARTMENT_OTHER): Payer: Self-pay | Admitting: Student

## 2023-06-18 ENCOUNTER — Other Ambulatory Visit (HOSPITAL_BASED_OUTPATIENT_CLINIC_OR_DEPARTMENT_OTHER): Payer: Self-pay | Admitting: Student

## 2023-06-18 DIAGNOSIS — I10 Essential (primary) hypertension: Secondary | ICD-10-CM

## 2023-06-18 MED ORDER — CHLORTHALIDONE 50 MG PO TABS: 50.0000 mg | ORAL_TABLET | Freq: Every day | ORAL | 6 refills | Status: DC

## 2023-06-18 NOTE — Telephone Encounter (Unsigned)
 Copied from CRM [id number]. Topic: Clinical - Medical Advice >> Jun 18, 2023  8:55 AM Gregory Lambert wrote: Reason for CRM: Patient left a message on his MyChart about blood pressure being 164-104 with an ongoing headache with no response so far. Patient states the PCP advised to call and report if medication is not working. Callback number is [phone]

## 2023-08-10 ENCOUNTER — Encounter (HOSPITAL_BASED_OUTPATIENT_CLINIC_OR_DEPARTMENT_OTHER): Payer: Self-pay

## 2023-08-26 ENCOUNTER — Ambulatory Visit (HOSPITAL_BASED_OUTPATIENT_CLINIC_OR_DEPARTMENT_OTHER): Admitting: Student

## 2023-09-02 ENCOUNTER — Ambulatory Visit (HOSPITAL_BASED_OUTPATIENT_CLINIC_OR_DEPARTMENT_OTHER): Admitting: Student

## 2023-09-15 ENCOUNTER — Ambulatory Visit (HOSPITAL_BASED_OUTPATIENT_CLINIC_OR_DEPARTMENT_OTHER): Admitting: Student

## 2023-09-15 ENCOUNTER — Encounter (HOSPITAL_BASED_OUTPATIENT_CLINIC_OR_DEPARTMENT_OTHER): Payer: Self-pay | Admitting: Student

## 2023-09-15 VITALS — BP 151/103 | HR 90 | Temp 98.0°F | Resp 16 | Ht 72.0 in | Wt 217.6 lb

## 2023-09-15 DIAGNOSIS — J4520 Mild intermittent asthma, uncomplicated: Secondary | ICD-10-CM

## 2023-09-15 DIAGNOSIS — I10 Essential (primary) hypertension: Secondary | ICD-10-CM

## 2023-09-15 DIAGNOSIS — F5101 Primary insomnia: Secondary | ICD-10-CM

## 2023-09-15 MED ORDER — METOPROLOL TARTRATE 25 MG PO TABS: 25.0000 mg | ORAL_TABLET | Freq: Two times a day (BID) | ORAL | 1 refills | Status: AC

## 2023-09-15 NOTE — Progress Notes (Signed)
 Established Patient Office Visit  Subjective   Patient ID: Gregory Lambert, male    DOB: 06-11-1990  Age: 33 y.o. MRN: [id number]  Chief Complaint  Patient presents with   Medical Management of Chronic Issues    Follow up. Stopped BP/fluid pills.     HPI  Discussed the use of AI scribe software for clinical note transcription with the patient, who gave verbal consent to proceed.  History of Present Illness   Gregory Lambert is a 33 year old male with hypertension who presents with uncontrolled blood pressure and persistent tinnitus.  He has stopped taking his previous antihypertensive medications, losartan and lisinopril, due to adverse effects. Losartan caused flu-like symptoms, including body aches and fatigue, while lisinopril led to coughing and lip swelling. He has not been on any blood pressure medication for about a month. He has previously tried metoprolol , reporting that his blood pressure was down and he did not notice any side effects. His mother also takes metoprolol , which has been effective for her.  He experiences persistent tinnitus in his left ear, describing the ringing as sometimes 'super loud'. Pressing on his ear occasionally provides relief. 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 There is no history of ear infections or the need for ear tubes as a child. He notes that when his blood pressure was lower, the ringing was less frequent.  He is currently taking trazodone  for sleep, which has improved his sleep duration. He goes to bed at 10 PM and wakes up at 5 AM, usually waking up only once during the night. He has mild asthma and seasonal allergies, but no recent exacerbations. He has not been using his inhaler frequently, only needing it in dusty environments like his shop. He has no history of severe allergic reactions and has not used an EpiPen  since receiving allergy shots years ago.      Patient Active Problem List   Diagnosis Date Noted    Primary insomnia 05/27/2023   Primary hypertension 05/27/2023   Mild intermittent asthma without complication 05/27/2023   Seasonal allergies 05/27/2023   Past Medical History:  Diagnosis Date   Anxiety    Asthma    Sinusitis    Urticaria    Social History   Tobacco Use   Smoking status: Every Day    Current packs/day: 0.25    Average packs/day: 0.3 packs/day for 16.6 years (4.1 ttl pk-yrs)    Types: Cigarettes    Start date: 2009    Passive exposure: Past   Smokeless tobacco: Current    Types: Chew  Vaping Use   Vaping status: Never Used  Substance Use Topics   Alcohol use: Yes    Comment: occasionally   Drug use: Not Currently   Allergies  Allergen Reactions   Montelukast     Gave a weird feeling   Prednisone Palpitations      ROS Per HPI.    Objective:     BP (!) 151/103   Pulse 90   Temp 98 F (36.7 C) (Oral)   Resp 16   Ht 6' (1.829 m)   Wt 217 lb 9.6 oz (98.7 kg)   SpO2 98%   BMI 29.51 kg/m  BP Readings from Last 3 Encounters:  09/15/23 (!) 151/103  05/27/23 (!) 152/104  03/01/23 133/79   Wt Readings from Last 3 Encounters:  09/15/23 217 lb 9.6 oz (98.7 kg)  05/27/23 218 lb 1.6 oz (98.9 kg)  04/26/19 219 lb 6.4 oz (99.5 kg)      Physical Exam Constitutional:      General: He is not in acute distress.    Appearance: Normal appearance. He is not ill-appearing.  HENT:     Head: Normocephalic and atraumatic.     Right Ear: External ear normal.     Left Ear: External ear normal.     Nose: Nose normal.  Eyes:     Conjunctiva/sclera: Conjunctivae normal.  Cardiovascular:     Rate and Rhythm: Normal rate and regular rhythm.     Pulses: Normal pulses.     Heart sounds: Normal heart sounds. No murmur heard.    No friction rub.  Pulmonary:     Effort: Pulmonary effort is normal. No respiratory distress.     Breath sounds: Normal breath sounds. No wheezing, rhonchi or rales.  Skin:    General: Skin is warm and dry.     Coloration: Skin  is not jaundiced or pale.  Neurological:     Mental Status: He is alert.  Psychiatric:        Mood and Affect: Mood normal.        Behavior: Behavior normal.      No results found for any visits on 09/15/23.  Last CBC Lab Results  Component Value Date   WBC 7.1 05/27/2023   HGB 15.9 05/27/2023   HCT 45.3 05/27/2023   MCV 94 05/27/2023   MCH 32.9 05/27/2023   RDW 11.8 05/27/2023   PLT 222 05/27/2023   Last metabolic panel Lab Results  Component Value Date   GLUCOSE 90 05/27/2023   NA 137 05/27/2023   K 4.7 05/27/2023   CL 95 (L) 05/27/2023   CO2 24 05/27/2023   BUN 15 05/27/2023   CREATININE 1.13 05/27/2023   EGFR 89 05/27/2023   CALCIUM 10.2 05/27/2023   PROT 7.8 05/27/2023   ALBUMIN 5.1 05/27/2023   LABGLOB 2.7 05/27/2023   AGRATIO 2.2 04/27/2017   BILITOT 1.0 05/27/2023   ALKPHOS 77 05/27/2023   AST 148 (H) 05/27/2023   ALT 190 (H) 05/27/2023   ANIONGAP 17 (H) 03/01/2023   Last lipids Lab Results  Component Value Date   CHOL 237 (H) 05/27/2023   HDL 50 05/27/2023   LDLCALC 142 (H) 05/27/2023   TRIG 252 (H) 05/27/2023   CHOLHDL 4.7 05/27/2023   Last hemoglobin A1c Lab Results  Component Value Date   HGBA1C 5.8 (H) 05/27/2023      The ASCVD Risk score (Arnett DK, et al., 2019) failed to calculate for the following reasons:   The 2019 ASCVD risk score is only valid for ages 79 to 35    Assessment & Plan:   Primary hypertension Assessment & Plan: Hypertension not controlled with previous medications (losartan, lisinopril, chlorthalidone ). Losartan caused flu-like symptoms, lisinopril caused cough and lip swelling. Chlorthalidone  showed no effect per patient. Considering familial response to metoprolol , which may be effective given family history. Heart rate appears suitable for this medication. - Prescribe metoprolol  25 mg twice daily - Discontinue losartan and chlorthalidone . - Monitor blood pressure response and adjust dosage as needed. -  Consider switching to Toprol  XL for once-daily dosing if twice-daily dosing is inconvenient.  Orders: -     Metoprolol  Tartrate; Take 1 tablet (25 mg total) by mouth 2 (two) times daily.  Dispense: 60 tablet; Refill: 1  Primary insomnia Assessment & Plan: Chronic, stable. Insomnia improved with trazodone , but still experiencing one awakening per night. Current  dose is 50 mg, with the option to increase to 100 mg if needed. Discussed the potential benefits of magnesium supplementation for sleep, noting it is low risk and has some supportive data, particularly in the elderly. - Continue trazodone  50 mg at bedtime, with the option to increase to 100 mg if needed for improved sleep. - Consider adding magnesium 400 mg at bedtime to potentially aid sleep.   Mild intermittent asthma without complication Assessment & Plan: Chronic, stable. Mild intermittent asthma with infrequent use of inhaler, primarily in dusty environments. No recent exacerbations or increased use of inhaler. - Continue as-needed use of inhaler for asthma symptoms.    Return in about 6 weeks (around 10/27/2023) for HTN.    Amoura Ransier T Valiant Dills, PA-C

## 2023-09-15 NOTE — Assessment & Plan Note (Signed)
 Chronic, stable. Mild intermittent asthma with infrequent use of inhaler, primarily in dusty environments. No recent exacerbations or increased use of inhaler. - Continue as-needed use of inhaler for asthma symptoms.

## 2023-09-15 NOTE — Assessment & Plan Note (Addendum)
 Chronic, not at goal. Hypertension not controlled with previous medications (losartan, lisinopril, chlorthalidone ). Losartan caused flu-like symptoms, lisinopril caused cough and lip swelling. Chlorthalidone  showed no effect per patient. Considering familial response to metoprolol , which may be effective given family history. Heart rate appears suitable for this medication. - Prescribe metoprolol  25 mg twice daily - Discontinue losartan and chlorthalidone . - Monitor blood pressure response and adjust dosage as needed. - Consider switching to Toprol  XL for once-daily dosing if twice-daily dosing is inconvenient.

## 2023-09-15 NOTE — Patient Instructions (Addendum)
 It was nice to see you today!  For your sleep you can also try CBT-I  A type of therapy known as CBT-I is considered the first-line treatment for insomnia. It may even be able to help 4/5 individuals get better sleep. At this time I am unaware of any institutions offer CBT-I within our area.  There are some online therapy use which can be both effective for sleep and cost effective as well.  Go! To Sleep- Web-based App-this is a Energy manager therapy platform created by [facility] clinic for patients with insomnia.  Last time checked, the cost was around $40 (pretty cheap for better sleep!).  CBT-I Coach- iOS/Android App- This app provides education about sleep and CBT-I, personalized feedback about your sleep, and an option for sleep diary to track wake and sleep times.  Sleepio-  iOS/Android App and Web-based App- A fairly popular option, may be worth a try!  [facility]omnia- (text based)- this option doesn't provide with audio or with an app but cory has some helpful information for you!  If you have any problems before your next visit feel free to message me via MyChart (minor issues or questions) or call the office, otherwise you may reach out to schedule an office visit.  Thank you! Tyreka Henneke, PA-C

## 2023-09-15 NOTE — Assessment & Plan Note (Signed)
 Chronic, stable. Insomnia improved with trazodone , but still experiencing one awakening per night. Current dose is 50 mg, with the option to increase to 100 mg if needed. Discussed the potential benefits of magnesium supplementation for sleep, noting it is low risk and has some supportive data, particularly in the elderly. - Continue trazodone  50 mg at bedtime, with the option to increase to 100 mg if needed for improved sleep. - Consider adding magnesium 400 mg at bedtime to potentially aid sleep.

## 2023-10-05 DIAGNOSIS — N200 Calculus of kidney: Secondary | ICD-10-CM | POA: Diagnosis not present

## 2023-10-05 DIAGNOSIS — Z9049 Acquired absence of other specified parts of digestive tract: Secondary | ICD-10-CM | POA: Diagnosis not present

## 2023-10-05 DIAGNOSIS — R110 Nausea: Secondary | ICD-10-CM | POA: Diagnosis not present

## 2023-10-05 DIAGNOSIS — F17210 Nicotine dependence, cigarettes, uncomplicated: Secondary | ICD-10-CM | POA: Diagnosis not present

## 2023-10-05 DIAGNOSIS — R1031 Right lower quadrant pain: Secondary | ICD-10-CM | POA: Diagnosis not present

## 2023-10-05 DIAGNOSIS — R7401 Elevation of levels of liver transaminase levels: Secondary | ICD-10-CM | POA: Diagnosis not present

## 2023-10-05 DIAGNOSIS — N3289 Other specified disorders of bladder: Secondary | ICD-10-CM | POA: Diagnosis not present

## 2023-10-05 DIAGNOSIS — I10 Essential (primary) hypertension: Secondary | ICD-10-CM | POA: Diagnosis not present

## 2023-10-27 ENCOUNTER — Ambulatory Visit (HOSPITAL_BASED_OUTPATIENT_CLINIC_OR_DEPARTMENT_OTHER): Admitting: Student

## 2023-10-27 DIAGNOSIS — M7711 Lateral epicondylitis, right elbow: Secondary | ICD-10-CM | POA: Diagnosis not present

## 2023-10-29 ENCOUNTER — Telehealth (HOSPITAL_BASED_OUTPATIENT_CLINIC_OR_DEPARTMENT_OTHER): Payer: Self-pay | Admitting: Student

## 2023-10-29 DIAGNOSIS — F5101 Primary insomnia: Secondary | ICD-10-CM

## 2023-10-29 NOTE — Telephone Encounter (Signed)
 Copied from CRM #[id number]. Topic: Clinical - Medication Refill >> Oct 29, 2023  3:35 PM Lauren C wrote: Medication: traZODone  (DESYREL ) 50 MG tablet  Has the patient contacted their pharmacy? Yes (Agent: If no, request that the patient contact the pharmacy for the refill. If patient does not wish to contact the pharmacy document the reason why and proceed with request.) (Agent: If yes, when and what did the pharmacy advise?)  They said that they had tried to get into contact with us  but were unable.   This is the patient's preferred pharmacy:  [facility] [address] - [place], [place] - [address] [place] [address] Phone: [phone] Fax: [phone]  Is this the correct pharmacy for this prescription? Yes If no, delete pharmacy and type the correct one.   Has the prescription been filled recently? Yes  Is the patient out of the medication? Yes  Has the patient been seen for an appointment in the last year OR does the patient have an upcoming appointment? Yes, last seen 09/15/23  Can we respond through MyChart? Yes, but a phone call would be good too.  Agent: Please be advised that Rx refills may take up to 3 business days. We ask that you follow-up with your pharmacy.

## 2023-11-02 MED ORDER — TRAZODONE HCL 50 MG PO TABS: 50.0000 mg | ORAL_TABLET | Freq: Every evening | ORAL | 0 refills | Status: AC | PRN

## 2023-11-08 ENCOUNTER — Ambulatory Visit (HOSPITAL_BASED_OUTPATIENT_CLINIC_OR_DEPARTMENT_OTHER): Admitting: Student

## 2023-11-11 ENCOUNTER — Ambulatory Visit (HOSPITAL_BASED_OUTPATIENT_CLINIC_OR_DEPARTMENT_OTHER): Admitting: Student

## 2023-11-17 ENCOUNTER — Ambulatory Visit (HOSPITAL_BASED_OUTPATIENT_CLINIC_OR_DEPARTMENT_OTHER): Admitting: Student

## 2023-11-22 DIAGNOSIS — S0501XA Injury of conjunctiva and corneal abrasion without foreign body, right eye, initial encounter: Secondary | ICD-10-CM | POA: Diagnosis not present

## 2024-02-15 DIAGNOSIS — R079 Chest pain, unspecified: Secondary | ICD-10-CM | POA: Diagnosis not present

## 2024-03-01 ENCOUNTER — Ambulatory Visit (HOSPITAL_BASED_OUTPATIENT_CLINIC_OR_DEPARTMENT_OTHER): Admitting: Student
